# Patient Record
Sex: Female | Born: 2000 | Race: Black or African American | Hispanic: No | Marital: Single | Smoking: Never smoker
Health system: Southern US, Community
[De-identification: ages and names within clinical notes are randomized; demographics above are authoritative.]

## PROBLEM LIST (undated history)

## (undated) HISTORY — PX: BREAST SURGERY: SHX581

## (undated) HISTORY — PX: TONSILLECTOMY: SUR1361

---
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
## 2020-11-27 ENCOUNTER — Other Ambulatory Visit: Payer: Self-pay

## 2020-11-27 ENCOUNTER — Emergency Department (HOSPITAL_COMMUNITY): Payer: Medicaid Other

## 2020-11-27 ENCOUNTER — Encounter (HOSPITAL_COMMUNITY): Payer: Self-pay

## 2020-11-27 ENCOUNTER — Emergency Department (HOSPITAL_COMMUNITY)
Admission: EM | Admit: 2020-11-27 | Discharge: 2020-11-27 | Disposition: A | Discharge status: Home / Self Care | Payer: Medicaid Other | Attending: Emergency Medicine | Admitting: Emergency Medicine

## 2020-11-27 DIAGNOSIS — Z5321 Procedure and treatment not carried out due to patient leaving prior to being seen by health care provider: Secondary | ICD-10-CM | POA: Insufficient documentation

## 2020-11-27 DIAGNOSIS — R079 Chest pain, unspecified: Secondary | ICD-10-CM | POA: Diagnosis not present

## 2020-11-27 NOTE — ED Triage Notes (Signed)
Pt to er, pt states that she is here for some chest pain.  Pt states that the pain started after a phone meeting.  Pt states that when she moves the pain is worse.

## 2021-08-05 ENCOUNTER — Encounter (HOSPITAL_COMMUNITY): Payer: Self-pay | Admitting: Emergency Medicine

## 2021-08-05 ENCOUNTER — Emergency Department (HOSPITAL_COMMUNITY): Payer: Medicaid Other

## 2021-08-05 ENCOUNTER — Other Ambulatory Visit: Payer: Self-pay

## 2021-08-05 ENCOUNTER — Emergency Department (HOSPITAL_COMMUNITY)
Admission: EM | Admit: 2021-08-05 | Discharge: 2021-08-05 | Disposition: A | Discharge status: Home / Self Care | Payer: Medicaid Other | Attending: Emergency Medicine | Admitting: Emergency Medicine

## 2021-08-05 DIAGNOSIS — R42 Dizziness and giddiness: Secondary | ICD-10-CM | POA: Insufficient documentation

## 2021-08-05 DIAGNOSIS — R0789 Other chest pain: Secondary | ICD-10-CM | POA: Diagnosis not present

## 2021-08-05 DIAGNOSIS — R079 Chest pain, unspecified: Secondary | ICD-10-CM | POA: Diagnosis present

## 2021-08-05 LAB — D-DIMER, QUANTITATIVE: D-Dimer, Quant: 0.51 ug/mL-FEU — ABNORMAL HIGH (ref 0.00–0.50)

## 2021-08-05 LAB — CBC
HCT: 35.4 % — ABNORMAL LOW (ref 36.0–46.0)
Hemoglobin: 10.4 g/dL — ABNORMAL LOW (ref 12.0–15.0)
MCH: 23.4 pg — ABNORMAL LOW (ref 26.0–34.0)
MCHC: 29.4 g/dL — ABNORMAL LOW (ref 30.0–36.0)
MCV: 79.6 fL — ABNORMAL LOW (ref 80.0–100.0)
Platelets: 254 10*3/uL (ref 150–400)
RBC: 4.45 MIL/uL (ref 3.87–5.11)
RDW: 17.7 % — ABNORMAL HIGH (ref 11.5–15.5)
WBC: 7.5 10*3/uL (ref 4.0–10.5)
nRBC: 0 % (ref 0.0–0.2)

## 2021-08-05 LAB — BASIC METABOLIC PANEL
Anion gap: 6 (ref 5–15)
BUN: 8 mg/dL (ref 6–20)
CO2: 24 mmol/L (ref 22–32)
Calcium: 8.8 mg/dL — ABNORMAL LOW (ref 8.9–10.3)
Chloride: 107 mmol/L (ref 98–111)
Creatinine, Ser: 0.78 mg/dL (ref 0.44–1.00)
GFR, Estimated: >60 mL/min (ref >60)
Glucose, Bld: 98 mg/dL (ref 70–99)
Potassium: 3.4 mmol/L — ABNORMAL LOW (ref 3.5–5.1)
Sodium: 137 mmol/L (ref 135–145)

## 2021-08-05 LAB — TROPONIN I (HIGH SENSITIVITY)
Troponin I (High Sensitivity): <2 ng/L (ref <18)
Troponin I (High Sensitivity): <2 ng/L (ref <18)

## 2021-08-05 MED ORDER — IOHEXOL 350 MG/ML SOLN
75.0000 mL | Freq: Once | INTRAVENOUS | Status: AC | PRN
  Administered 2021-08-05: 75 mL via INTRAVENOUS

## 2021-08-05 NOTE — ED Provider Notes (Signed)
[facility] EMERGENCY DEPARTMENT Provider Note   CSN: [id number] Arrival date & time: 08/05/21  1424     History Chief Complaint  Patient presents with   Chest Pain    Mikayla Jordan is a 20 y.o. female.   Chest Pain Associated symptoms: dizziness   Associated symptoms: no abdominal pain, no back pain, no cough, no fatigue, no fever, no nausea, no numbness, no palpitations, no shortness of breath, no vomiting and no weakness        Mikayla Jordan is a 20 y.o. female who presents to the Emergency Department complaining of sudden onset of right-sided chest pain, dizziness and lightheadedness.  Symptoms began while driving.  She describes the right-sided chest pain as sharp and stabbing in quality.  Pain was brief lasting 2 to 3 minutes then spontaneously resolved.  Chest pain was associated with feeling lightheaded and dizzy which also resolved.  The dizziness returned after several minutes and she contacted EMS.  She endorses recent increased stressors and anxiety.  She states that she lost both parents and has to take care of her brother.  She also notes having elevated blood pressure today.  No chest pain or dizziness upon arrival.  She denies recent illness, fever, chills, cough or back pain.  She denies any new medications, illicit drug use, alcohol or smoking.  No history of PE or DVT.  She does not use any forms of birth control   History reviewed. No pertinent past medical history.  There are no problems to display for this patient.   Past Surgical History:  Procedure Laterality Date   BREAST SURGERY     TONSILLECTOMY       OB History   No obstetric history on file.     History reviewed. No pertinent family history.  Social History   Tobacco Use   Smoking status: Never   Smokeless tobacco: Never  Vaping Use   Vaping Use: Never used  Substance Use Topics   Alcohol use: Never   Drug use: Never    Home Medications Prior to Admission medications   Not on  File    Allergies    Patient has no known allergies.  Review of Systems   Review of Systems  Constitutional:  Negative for chills, fatigue and fever.  Respiratory:  Negative for cough and shortness of breath.   Cardiovascular:  Positive for chest pain. Negative for palpitations.  Gastrointestinal:  Negative for abdominal pain, nausea and vomiting.  Genitourinary:  Negative for dysuria, flank pain and hematuria.  Musculoskeletal:  Negative for arthralgias, back pain, myalgias, neck pain and neck stiffness.  Skin:  Negative for rash.  Neurological:  Positive for dizziness and light-headedness. Negative for seizures, syncope, speech difficulty, weakness and numbness.  Hematological:  Does not bruise/bleed easily.  Psychiatric/Behavioral:  Negative for confusion.    Physical Exam Updated Vital Signs BP 131/82   Pulse 82   Temp 98.1 F (36.7 C) (Oral)   Resp (!) 22   Ht 5\' 4"  (1.626 m)   Wt 95.3 kg   SpO2 100%   BMI 36.05 kg/m   Physical Exam Vitals and nursing note reviewed.  Constitutional:      Appearance: Normal appearance. She is not ill-appearing or toxic-appearing.  HENT:     Head: Normocephalic.  Eyes:     Conjunctiva/sclera: Conjunctivae normal.     Pupils: Pupils are equal, round, and reactive to light.  Cardiovascular:     Rate and Rhythm: Normal rate and regular  rhythm.     Pulses: Normal pulses.  Pulmonary:     Effort: Pulmonary effort is normal.     Breath sounds: Normal breath sounds. No wheezing.  Chest:     Chest wall: No tenderness.  Abdominal:     Palpations: Abdomen is soft.     Tenderness: There is no abdominal tenderness. There is no guarding or rebound.  Musculoskeletal:        General: Normal range of motion.     Cervical back: Normal range of motion.     Right lower leg: No edema.     Left lower leg: No edema.  Skin:    General: Skin is warm.     Capillary Refill: Capillary refill takes less than 2 seconds.     Findings: No rash.   Neurological:     General: No focal deficit present.     Mental Status: She is alert.     Sensory: No sensory deficit.     Motor: No weakness.  Psychiatric:        Mood and Affect: Mood normal.        Thought Content: Thought content normal.    ED Results / Procedures / Treatments   Labs (all labs ordered are listed, but only abnormal results are displayed) Labs Reviewed  BASIC METABOLIC PANEL - Abnormal; Notable for the following components:      Result Value   Potassium 3.4 (*)    Calcium 8.8 (*)    All other components within normal limits  CBC - Abnormal; Notable for the following components:   Hemoglobin 10.4 (*)    HCT 35.4 (*)    MCV 79.6 (*)    MCH 23.4 (*)    MCHC 29.4 (*)    RDW 17.7 (*)    All other components within normal limits  D-DIMER, QUANTITATIVE - Abnormal; Notable for the following components:   D-Dimer, Quant 0.51 (*)    All other components within normal limits  POC URINE PREG, ED  TROPONIN I (HIGH SENSITIVITY)  TROPONIN I (HIGH SENSITIVITY)    EKG Vent. rate 113 BPM PR interval 135 ms QRS duration 89 ms QT/QTcB 334/464 ms P-R-T axes 40 36 -8  Text interpretation: Sinus tachycardia with right atrial enlargement.    EKG reviewed by Dr. Bernette Mayers.  Radiology DG Chest 2 View  Result Date: 08/05/2021 CLINICAL DATA:  Chest pain and dizziness. EXAM: CHEST - 2 VIEW COMPARISON:  Chest radiograph 11/27/2020. FINDINGS: Stable cardiac and mediastinal contours. Minimal heterogeneous opacities right lung base. No pleural effusion or pneumothorax. Osseous structures unremarkable. IMPRESSION: Minimal heterogeneous opacities right lung base may represent atelectasis or infection. Electronically Signed   By: Annia Belt M.D.   On: 08/05/2021 15:57    Procedures   Medications Ordered in ED Medications  iohexol (OMNIPAQUE) 350 MG/ML injection 75 mL (75 mLs Intravenous Contrast Given 08/05/21 1715)    ED Course  I have reviewed the triage vital signs and  the nursing notes.  Pertinent labs & imaging results that were available during my care of the patient were reviewed by me and considered in my medical decision making (see chart for details).    MDM Rules/Calculators/A&P                           Patient here for evaluation of chest pain, dizziness and lightheadedness.  Symptoms were sudden in onset earlier today.  She notes increased stressors recently.  No  known history of anxiety or PE or DVT.  She does not use any forms of birth control or smoke cigarettes.  Symptoms have resolved upon arrival.  Vital signs reviewed.  On exam, patient well-appearing nontoxic.  Mildly hypertensive.  No history of hypertension.  She is also tachypneic.  No hypoxia. Labs interpreted by me, show no leukocytosis.  No electrolyte derangement.  Delta troponin is reassuring.  EKG without acute ischemic change.  Chest x-ray shows some heterogeneous opacities of the right lung base that may represent atelectasis or infection.  No clinical findings to suggest infection as patient is afebrile, she does not have an elevated white count and she is without cough.  D-dimer is only mildly elevated but given that patient has some risk factors, I will proceed with CT angio of the chest.  On recheck, patient resting comfortably.  Denies any symptoms at this time.  Blood pressure improved, systolic of 137.  CT angio of the chest is negative for evidence of PE.  Symptoms felt to be related to anxiety.  Doubt ACS.  I feel that patient is appropriate for discharge home, will give referral information to establish primary care.  All questions were answered.  Return precautions were discussed.   Final Clinical Impression(s) / ED Diagnoses Final diagnoses:  Atypical chest pain    Rx / DC Orders ED Discharge Orders     None        Pauline Aus, PA-C 08/05/21 1855    Pollyann Savoy, MD 08/06/21 [phone]

## 2021-08-05 NOTE — ED Triage Notes (Signed)
Pt arrived VIA [facility]. Pt c/o of chest pain and dizziness that started today.

## 2021-08-05 NOTE — Discharge Instructions (Addendum)
The CT of your chest today did not show evidence of a blood clot or pneumonia.  Your work-up was overall reassuring.  Your symptoms may be related to stress or anxiety.  I recommend that you follow-up with primary care.  I have provided a list of local providers to establish primary care.  I have also given you a list of resources for counseling if needed.  Return to the emergency department for any new or worsening symptoms.

## 2021-08-05 NOTE — ED Notes (Signed)
Patient denies any chest pain at this moment. Reports mild dizziness at the moment. States she has been under a lot of stress lately and not been eating well. Patient alert and orientated x4. Ambulatory.

## 2021-11-10 ENCOUNTER — Other Ambulatory Visit: Payer: Self-pay

## 2021-11-10 ENCOUNTER — Emergency Department (HOSPITAL_COMMUNITY)
Admission: EM | Admit: 2021-11-10 | Discharge: 2021-11-10 | Disposition: A | Discharge status: Home / Self Care | Payer: Medicaid Other | Attending: Emergency Medicine | Admitting: Emergency Medicine

## 2021-11-10 ENCOUNTER — Encounter (HOSPITAL_COMMUNITY): Payer: Self-pay

## 2021-11-10 DIAGNOSIS — Z79899 Other long term (current) drug therapy: Secondary | ICD-10-CM | POA: Insufficient documentation

## 2021-11-10 DIAGNOSIS — I10 Essential (primary) hypertension: Secondary | ICD-10-CM | POA: Diagnosis not present

## 2021-11-10 LAB — COMPREHENSIVE METABOLIC PANEL
ALT: 20 U/L (ref 0–44)
AST: 30 U/L (ref 15–41)
Albumin: 4.1 g/dL (ref 3.5–5.0)
Alkaline Phosphatase: 60 U/L (ref 38–126)
Anion gap: 8 (ref 5–15)
BUN: 9 mg/dL (ref 6–20)
CO2: 24 mmol/L (ref 22–32)
Calcium: 9.4 mg/dL (ref 8.9–10.3)
Chloride: 105 mmol/L (ref 98–111)
Creatinine, Ser: 0.65 mg/dL (ref 0.44–1.00)
GFR, Estimated: >60 mL/min (ref >60)
Glucose, Bld: 81 mg/dL (ref 70–99)
Potassium: 4.1 mmol/L (ref 3.5–5.1)
Sodium: 137 mmol/L (ref 135–145)
Total Bilirubin: 0.5 mg/dL (ref 0.3–1.2)
Total Protein: 7.4 g/dL (ref 6.5–8.1)

## 2021-11-10 LAB — CBC WITH DIFFERENTIAL/PLATELET
Abs Immature Granulocytes: 0.02 10*3/uL (ref 0.00–0.07)
Basophils Absolute: 0.1 10*3/uL (ref 0.0–0.1)
Basophils Relative: 1 %
Eosinophils Absolute: 0.4 10*3/uL (ref 0.0–0.5)
Eosinophils Relative: 5 %
HCT: 34.8 % — ABNORMAL LOW (ref 36.0–46.0)
Hemoglobin: 10.1 g/dL — ABNORMAL LOW (ref 12.0–15.0)
Immature Granulocytes: 0 %
Lymphocytes Relative: 27 %
Lymphs Abs: 2.3 10*3/uL (ref 0.7–4.0)
MCH: 23.2 pg — ABNORMAL LOW (ref 26.0–34.0)
MCHC: 29 g/dL — ABNORMAL LOW (ref 30.0–36.0)
MCV: 79.8 fL — ABNORMAL LOW (ref 80.0–100.0)
Monocytes Absolute: 0.7 10*3/uL (ref 0.1–1.0)
Monocytes Relative: 9 %
Neutro Abs: 4.9 10*3/uL (ref 1.7–7.7)
Neutrophils Relative %: 58 %
Platelets: 317 10*3/uL (ref 150–400)
RBC: 4.36 MIL/uL (ref 3.87–5.11)
RDW: 18 % — ABNORMAL HIGH (ref 11.5–15.5)
WBC: 8.4 10*3/uL (ref 4.0–10.5)
nRBC: 0 % (ref 0.0–0.2)

## 2021-11-10 MED ORDER — LISINOPRIL 10 MG PO TABS: 10.0000 mg | ORAL_TABLET | Freq: Every day | ORAL | 0 refills | Status: DC

## 2021-11-10 NOTE — Discharge Instructions (Signed)
Follow-up with your family doctor in the next 1 to 2 weeks for your blood pressure

## 2021-11-10 NOTE — ED Provider Notes (Signed)
[facility] EMERGENCY DEPARTMENT Provider Note   CSN: [id number] Arrival date & time: 11/10/21  1907     History  Chief Complaint  Patient presents with   Hypertension    Dizziness     Mikayla Jordan is a 21 y.o. female.  Patient complains of dizziness.  She has had high blood pressure before that has caused dizziness.  She never followed up with anybody when she was seen for this.  She is on no other medicines  The history is provided by the patient and medical records. A language interpreter was used.  Hypertension This is a recurrent problem. The current episode started more than 2 days ago. The problem occurs constantly. The problem has not changed since onset.Pertinent negatives include no chest pain, no abdominal pain and no headaches. Nothing aggravates the symptoms. Nothing relieves the symptoms. She has tried nothing for the symptoms.      Home Medications Prior to Admission medications   Medication Sig Start Date End Date Taking? Authorizing Provider  lisinopril (ZESTRIL) 10 MG tablet Take 1 tablet (10 mg total) by mouth daily. 11/10/21  Yes Milton Ferguson, MD      Allergies    Patient has no known allergies.    Review of Systems   Review of Systems  Constitutional:  Negative for appetite change and fatigue.  HENT:  Negative for congestion, ear discharge and sinus pressure.   Eyes:  Negative for discharge.  Respiratory:  Negative for cough.   Cardiovascular:  Negative for chest pain.  Gastrointestinal:  Negative for abdominal pain and diarrhea.  Genitourinary:  Negative for frequency and hematuria.  Musculoskeletal:  Negative for back pain.  Skin:  Negative for rash.  Neurological:  Positive for dizziness. Negative for seizures and headaches.  Psychiatric/Behavioral:  Negative for hallucinations.    Physical Exam Updated Vital Signs BP (!) 133/91    Pulse 90    Temp 99.3 F (37.4 C) (Oral)    Resp 14    Ht 5\' 4"  (1.626 m)    Wt 90.7 kg    SpO2 100%    BMI  34.33 kg/m  Physical Exam Vitals and nursing note reviewed.  Constitutional:      Appearance: She is well-developed.  HENT:     Head: Normocephalic.     Nose: Nose normal.  Eyes:     General: No scleral icterus.    Conjunctiva/sclera: Conjunctivae normal.  Neck:     Thyroid: No thyromegaly.  Cardiovascular:     Rate and Rhythm: Normal rate and regular rhythm.     Heart sounds: No murmur heard.   No friction rub. No gallop.  Pulmonary:     Breath sounds: No stridor. No wheezing or rales.  Chest:     Chest wall: No tenderness.  Abdominal:     General: There is no distension.     Tenderness: There is no abdominal tenderness. There is no rebound.  Musculoskeletal:        General: Normal range of motion.     Cervical back: Neck supple.  Lymphadenopathy:     Cervical: No cervical adenopathy.  Skin:    Findings: No erythema or rash.  Neurological:     Mental Status: She is alert and oriented to person, place, and time.     Motor: No abnormal muscle tone.     Coordination: Coordination normal.  Psychiatric:        Behavior: Behavior normal.    ED Results / Procedures / Treatments  Labs (all labs ordered are listed, but only abnormal results are displayed) Labs Reviewed  CBC WITH DIFFERENTIAL/PLATELET - Abnormal; Notable for the following components:      Result Value   Hemoglobin 10.1 (*)    HCT 34.8 (*)    MCV 79.8 (*)    MCH 23.2 (*)    MCHC 29.0 (*)    RDW 18.0 (*)    All other components within normal limits  COMPREHENSIVE METABOLIC PANEL    EKG None  Radiology No results found.  Procedures Procedures    Medications Ordered in ED Medications - No data to display  ED Course/ Medical Decision Making/ A&P                           Medical Decision Making  Patient with mild hypertension and dizziness.  Labs unremarkable and EKG unremarkable.  Patient is placed on a low-dose of lisinopril and will follow up with her PCP   This patient presents to  the ED for concern of dizziness, this involves an extensive number of treatment options, and is a complaint that carries with it a high risk of complications and morbidity.  The differential diagnosis includes hypertension, vertigo, CVA   Co morbidities that complicate the patient evaluation  Hypertension   Additional history obtained:  Additional history obtained from patient External records from outside source obtained and reviewed including hospital record   Lab Tests:  I Ordered, and personally interpreted labs.  The pertinent results include: Hemoglobin showed some anemia at 10.1   Imaging Studies ordered:  No x-rays  Cardiac Monitoring:  The patient was maintained on a cardiac monitor.  I personally viewed and interpreted the cardiac monitored which showed an underlying rhythm of: Normal sinus rhythm   Medicines ordered and prescription drug management:  No medicines given Reevaluation of the patient after these medicines showed that the patient stayed the same I have reviewed the patients home medicines and have made adjustments as needed   Test Considered:  CT head  Critical Interventions:  None  Consultations Obtained: No consult   Problem List / ED Course:  Hypertension dizziness   Reevaluation:  After the interventions noted above, I reevaluated the patient and found that they have :improved   Social Determinants of Health:  None   Dispostion:  After consideration of the diagnostic results and the patients response to treatment, I feel that the patent would benefit from discharge home on a low-dose lisinopril and follow-up with PCP.         Final Clinical Impression(s) / ED Diagnoses Final diagnoses:  Primary hypertension    Rx / DC Orders ED Discharge Orders          Ordered    lisinopril (ZESTRIL) 10 MG tablet  Daily        11/10/21 2310              Milton Ferguson, MD 11/12/21 1149

## 2021-11-10 NOTE — ED Triage Notes (Addendum)
Pt reports dizziness and hypertension that started today around 6 pm, pt says she had an episode in October, was seen here and told it was stress induced and instructed to f/u with Dr, however pt says she has had any f/u. Per EMS pt BP was 142/108.  Pt denies dizziness at this time.

## 2022-01-11 ENCOUNTER — Ambulatory Visit: Payer: Medicaid Other | Admitting: Nurse Practitioner

## 2022-03-06 ENCOUNTER — Ambulatory Visit: Payer: Medicaid Other | Admitting: Nurse Practitioner

## 2022-03-24 ENCOUNTER — Encounter (HOSPITAL_COMMUNITY): Payer: Self-pay | Admitting: Emergency Medicine

## 2022-03-24 ENCOUNTER — Emergency Department (HOSPITAL_COMMUNITY)
Admission: EM | Admit: 2022-03-24 | Discharge: 2022-03-24 | Disposition: A | Discharge status: Home / Self Care | Payer: Medicaid Other | Attending: Emergency Medicine | Admitting: Emergency Medicine

## 2022-03-24 DIAGNOSIS — I10 Essential (primary) hypertension: Secondary | ICD-10-CM | POA: Insufficient documentation

## 2022-03-24 DIAGNOSIS — D500 Iron deficiency anemia secondary to blood loss (chronic): Secondary | ICD-10-CM | POA: Diagnosis not present

## 2022-03-24 DIAGNOSIS — Z72820 Sleep deprivation: Secondary | ICD-10-CM | POA: Diagnosis not present

## 2022-03-24 DIAGNOSIS — F410 Panic disorder [episodic paroxysmal anxiety] without agoraphobia: Secondary | ICD-10-CM | POA: Diagnosis present

## 2022-03-24 LAB — I-STAT CHEM 8, ED
BUN: 5 mg/dL — ABNORMAL LOW (ref 6–20)
Calcium, Ion: 1.24 mmol/L (ref 1.15–1.40)
Chloride: 106 mmol/L (ref 98–111)
Creatinine, Ser: 0.8 mg/dL (ref 0.44–1.00)
Glucose, Bld: 106 mg/dL — ABNORMAL HIGH (ref 70–99)
HCT: 30 % — ABNORMAL LOW (ref 36.0–46.0)
Hemoglobin: 10.2 g/dL — ABNORMAL LOW (ref 12.0–15.0)
Potassium: 3.6 mmol/L (ref 3.5–5.1)
Sodium: 141 mmol/L (ref 135–145)
TCO2: 24 mmol/L (ref 22–32)

## 2022-03-24 LAB — I-STAT BETA HCG BLOOD, ED (MC, WL, AP ONLY): I-stat hCG, quantitative: <5 m[IU]/mL (ref <5)

## 2022-03-24 NOTE — ED Notes (Signed)
Patient verbalizes understanding of discharge instructions. Opportunity for questioning and answers were provided. Armband removed by staff, pt discharged from ED. Ambulated out to lobby  

## 2022-03-24 NOTE — Discharge Instructions (Signed)
Substance Abuse Treatment Programs ° °Intensive Outpatient Programs °[facility]     °[address]      °[place], [place]                   °[phone]      ° °[facility] °[address] °[place], [place] °[phone] ° °[facility]     °(Inpatient and outpatient)     °[address]           °[phone]   ° °[facility] °336-288-1484 (Suboxone and Methadone) ° °[address]      °[place], [place] [address]      °336-882-2125      ° °[address] °[place], [place] °852-3033 ° °[facility] (Outpatient/Inpatient, Chemical)    °(insurance only) [phone]      °       °[facility] (Groups & Residential) °[place], [place] °336-389-1413 ° °   °[facility]     °[address]     °[place], [place]      °336-389-1413      ° °[facility] (for caregivers and family) °[address] °[place], [place] °336-299-4655 ° ° ° ° ° °Residential Treatment Programs °[facility]      °[address], [place], [place] [address]  °([phone]      ° °[facility] °[address] [place], [place] [address] °[phone] ° °[facility]        °336-248-8914      ° °[facility] °1-800-659-3381 ° °[facility]             °[address]                                         °[place], [place]                                                °877-615-2722 or 336-784-9470                              ° °[facility] [place] °[address] °[place], [address] °1.877.941.8954 ° °[facility]    °[address]      °[place], [place]     °[phone]      ° °[facility] °[address] °[place], [place] °336-285-9073 ° °[facility]   °[address]     °[place], [place] [address]     °[phone]      °Admissions: 8am-3pm M-F ° °[facility] °[address] °[place] °336-227-7417 ° °[facility]: Residential Program (90 Days)   °[place], [place]      °336-725-8389 or 800-758-6077    ° °[facility] °[place], [place] °(Walk in Hours over the weekend or by referral) ° °[facility] °[address], [place], [place] [address] °([phone] ° °Crisis Mobile: [facility]:  1-877-626-1772 (for crisis response 24 hours a day) °[facility] Hotline:      1-800-256-2452 °Outpatient Psychiatry and Counseling ° °[facility]: Mobile Crisis   Management 24 hours:  1-877-626-1772 ° °[facility] sliding scale fee and walk in schedule: M-F 8am-12pm/1pm-3pm °[address]  °[place], [place] [address] °[phone] ° °[facility] °[address] °[place], [place] [address] °336-703-9650 ° °[facility] ([facility] known [facility])- new patient walk-in appointments available Monday - Friday 8am -3pm.          °[address] °[place], [place] [address] °336-676-6840 or crisis line- 336-676-6905 ° °[facility] Intensive Outpatient Therapy Program °[address] °[place], [place] [address] °336-832-9804 ° °[facility]                  °Crisis Services      °336.641.4993      °[address]     °[place], [place] [address]                ° °[facility]   °[facility] °[phone] °[address] °[place], [place] [address] ° ° °[facility]          °[address],  °[place], [place] [address]       °([phone] ° °[facility] °[address] °[place], [place] [address] °336-292-1510 ° °[facility]    °[address]    °[place], [place] [address]     °336-632-3505      ° °Parish McKinney, MD     °[address]     °[place] [address]     °336-282-1251     °  °[facility] °[address] °[place] [address] ° °[facility]     °[address]     °[place], [place]      °336-542-2076      ° °[facility] °Shamsher Ahluwalia, MD °[address] °[place], [place] [address] °336-420-9558 ° °[facility]     °[address]     °[place], [place] [address]     °336-274-1237      ° °[facility] °[address] °[place], [place] [address] °336-854-4450 °Resources for Temporary Residential Assistance/Crisis Centers ° °[facility] CENTERS °[facility] °M-F 8am-3pm   °[address] [place], [place] [address]   336-332-0824 °Services include: laundry, barbering, support groups, case management, phone  & computer access, showers, AA/NA mtgs, mental health/substance abuse nurse, job skills class, disability information, VA assistance, spiritual classes, etc.  ° °HOMELESS SHELTERS ° °[facility]     °[facility]   °[address] [place] [address], [place]     °336.271.5959       °       °[facility] (women and children)       °[address] °, [place] [address] °336-275-0820 °[email or web address] for application and process °Application Required ° °[facility]   °[address]    °[place] [address]     °336.886.4922       °             °[facility] °[address] °, [place] [address] °336.273.5572 °336-235-0363(schedule application appt.) °Application Required ° °[facility] (women only)    °[address]     °[place], [place] [address]     °336-884-1039      °  Intake starts 6pm daily °Need valid ID, SSC, & Police report °[facility] [place] °[address] °[place], [place] °336-881-5420 °Application Required ° °[facility] (men only)     °[address]      °[place], [place]     °[phone]      ° °[facility] °(Pregnant women only) °[address] °[place], [place] °336-275-0206 ° °[facility]      °[address]      °[place], [place] [address]     °336-722-9951      °       °[facility] °[address] °[place], [place] °336-723-1848 °90 day commitment/SA/Application process ° °[facility](men only)     °[address]     °[place], [place]     °[phone]       °Check-in at 7pm     °       °[facility] °[address] °[place], [place] [address] °336-248-6684 °Men/Women/Women and Children must be there by 7 pm ° °[facility] °[place], [place] °336-722-8721                ° °

## 2022-03-24 NOTE — ED Provider Notes (Signed)
[facility] EMERGENCY DEPARTMENT Provider Note   CSN: [id number] Arrival date & time: 03/24/22  0143     History  Chief Complaint  Patient presents with   Anxiety    Mikayla Jordan is a 21 y.o. female.  The history is provided by the patient.  Anxiety This is a new problem. The problem occurs constantly. The problem has been gradually improving. Associated symptoms include shortness of breath. Pertinent negatives include no chest pain.  Patient presents for concerns for anxiety attack.  She reports several hours ago she started feeling anxious, had numbness in her hands and feet and felt short of breath.  She called EMS and they checked her blood pressure.  Since her symptoms returned she decided to be checked out in the ER.  She is not currently being treated for anxiety.  She reports significant stressors with family, as well as home life and keeping up with a full college workload    Home Medications Prior to Admission medications   Medication Sig Start Date End Date Taking? Authorizing Provider  lisinopril (ZESTRIL) 10 MG tablet Take 1 tablet (10 mg total) by mouth daily. 11/10/21   Milton Ferguson, MD      Allergies    Patient has no known allergies.    Review of Systems   Review of Systems  Respiratory:  Positive for shortness of breath.   Cardiovascular:  Negative for chest pain.  Neurological:  Positive for numbness. Negative for weakness.  Psychiatric/Behavioral:  Positive for sleep disturbance. Negative for suicidal ideas. The patient is nervous/anxious.    Physical Exam Updated Vital Signs BP (!) 142/108   Pulse 94   Temp 98.3 F (36.8 C) (Oral)   Resp 17   Wt 95.3 kg   SpO2 100%   BMI 36.05 kg/m  Physical Exam CONSTITUTIONAL: Well developed/well nourished, makes good eye contact HEAD: Normocephalic/atraumatic EYES: EOMI ENMT: Mucous membranes moist NECK: supple no meningeal signs CV: S1/S2 noted, no murmurs/rubs/gallops noted LUNGS: Lungs are clear to  auscultation bilaterally, no apparent distress NEURO: Pt is awake/alert/appropriate, moves all extremitiesx4.  No facial droop.  No arm or leg drift.   EXTREMITIES: pulses normal/equalx4, full ROM SKIN: warm, color normal PSYCH: Mildly anxious  ED Results / Procedures / Treatments   Labs (all labs ordered are listed, but only abnormal results are displayed) Labs Reviewed  I-STAT CHEM 8, ED - Abnormal; Notable for the following components:      Result Value   BUN 5 (*)    Glucose, Bld 106 (*)    Hemoglobin 10.2 (*)    HCT 30.0 (*)    All other components within normal limits  I-STAT BETA HCG BLOOD, ED ([facility], [facility], [facility] ONLY) - Normal  I-STAT BETA HCG BLOOD, ED ([facility], [facility], [facility] ONLY)    EKG EKG Interpretation  Date/Time:  Saturday Mar 24 2022 03:15:07 EDT Ventricular Rate:  94 PR Interval:  179 QRS Duration: 87 QT Interval:  352 QTC Calculation: 441 R Axis:   36 Text Interpretation: Sinus rhythm Borderline T abnormalities, inferior leads No significant change since last tracing Confirmed by Ripley Fraise [phone]) on 03/24/2022 3:25:17 AM  Radiology No results found.  Procedures Procedures    Medications Ordered in ED Medications - No data to display  ED Course/ Medical Decision Making/ A&P                           Medical Decision Making Amount and/or Complexity  of Data Reviewed ECG/medicine tests: ordered.   This patient presents to the ED for concern of numbness and anxiety, this involves an extensive number of treatment options, and is a complaint that carries with it a high risk of complications and morbidity.  The differential diagnosis includes but is not limited to panic attack, electrolyte disturbance, CVA  Comorbidities that complicate the patient evaluation: Patient's presentation is complicated by their history of hypertension  Social Determinants of Health: Patient's lack of prescription access and impaired access to primary care  increases the complexity of  managing their presentation   Lab Tests: I Ordered, and personally interpreted labs.  The pertinent results include: Chronic anemia   Patient's presentation is most consistent with  acute complicated illness/injury requiring diagnostic workup  Disposition: After consideration of the diagnostic results and the patient's response to treatment,  I feel that the patent would benefit from discharge   .    Patient presents with probable anxiety attack.  She is now feeling improved.  She described diffuse numbness and shortness of breath.  She reports decreased sleep recently and significant stressors.  She denies suicidal thoughts.  She declines any medications at this time.  I encouraged her to follow-up as an outpatient as she would benefit from therapy.  She reports she has good support at her church.        Final Clinical Impression(s) / ED Diagnoses Final diagnoses:  Anxiety attack    Rx / DC Orders ED Discharge Orders     None         Ripley Fraise, MD 03/24/22 [phone]

## 2022-03-24 NOTE — ED Notes (Signed)
I-stat Beta NEGATIVE. Machine not crossing over

## 2022-03-24 NOTE — ED Triage Notes (Signed)
Pt in from home with c/o bilateral hand and leg numbness and tingling, onset at 0030 tonight. Pt states she has been anxious throughout the night, called EMS to her house PTA in ED, and they said to check in at ED if anxiety worsened. Pt states worsened anxiety over past hour. Denies any cp, dizziness or sob.

## 2022-04-09 ENCOUNTER — Ambulatory Visit: Payer: Medicaid Other | Admitting: Family Medicine

## 2022-05-01 ENCOUNTER — Ambulatory Visit
Admission: EM | Admit: 2022-05-01 | Discharge: 2022-05-01 | Disposition: A | Discharge status: Home / Self Care | Payer: Medicaid Other | Attending: Family Medicine | Admitting: Family Medicine

## 2022-05-01 DIAGNOSIS — T148XXA Other injury of unspecified body region, initial encounter: Secondary | ICD-10-CM | POA: Diagnosis not present

## 2022-05-04 NOTE — ED Provider Notes (Signed)
[facility]    CSN: [id number] Arrival date & time: 05/01/22  1933      History   Chief Complaint Chief Complaint  Patient presents with   Foreign Body in Skin    HPI Mikayla Jordan is a 21 y.o. female.   Presenting today with a large splinter to the palm of her right hand that occurred today while helping a friend on a wooden deck.  She states she tried pulling it out with her hand but it started splitting into little pieces.  Denies redness, drainage, bleeding, numbness, tingling, decreased range of motion.  Thinks her last tetanus shot was within the last 5 years.   History reviewed. No pertinent past medical history.  There are no problems to display for this patient.   Past Surgical History:  Procedure Laterality Date   BREAST SURGERY     TONSILLECTOMY      OB History   No obstetric history on file.      Home Medications    Prior to Admission medications   Medication Sig Start Date End Date Taking? Authorizing Provider  lisinopril (ZESTRIL) 10 MG tablet Take 1 tablet (10 mg total) by mouth daily. 11/10/21   Bethann Berkshire, MD    Family History History reviewed. No pertinent family history.  Social History Social History   Tobacco Use   Smoking status: Never   Smokeless tobacco: Never  Vaping Use   Vaping Use: Never used  Substance Use Topics   Alcohol use: Never   Drug use: Never     Allergies   Patient has no known allergies.   Review of Systems Review of Systems Per HPI  Physical Exam Triage Vital Signs ED Triage Vitals  Enc Vitals Group     BP 05/01/22 1945 123/79     Pulse Rate 05/01/22 1945 88     Resp 05/01/22 1945 16     Temp 05/01/22 1945 98 F (36.7 C)     Temp Source 05/01/22 1945 Oral     SpO2 05/01/22 1945 98 %     Weight --      Height --      Head Circumference --      Peak Flow --      Pain Score 05/01/22 1946 0     Pain Loc --      Pain Edu? --      Excl. in GC? --    No data found.  Updated  Vital Signs BP 123/79 (BP Location: Right Arm)   Pulse 88   Temp 98 F (36.7 C) (Oral)   Resp 16   LMP 04/24/2022   SpO2 98%   Visual Acuity Right Eye Distance:   Left Eye Distance:   Bilateral Distance:    Right Eye Near:   Left Eye Near:    Bilateral Near:     Physical Exam Vitals and nursing note reviewed.  Constitutional:      Appearance: Normal appearance. She is not ill-appearing.  HENT:     Head: Atraumatic.  Eyes:     Extraocular Movements: Extraocular movements intact.     Conjunctiva/sclera: Conjunctivae normal.  Cardiovascular:     Rate and Rhythm: Normal rate and regular rhythm.     Heart sounds: Normal heart sounds.  Pulmonary:     Effort: Pulmonary effort is normal.     Breath sounds: Normal breath sounds.  Musculoskeletal:        General: Normal range of motion.  Cervical back: Normal range of motion and neck supple.  Skin:    General: Skin is warm.     Comments: 1.5 cm wooden splinter present to palmar aspect of right hand.  No bleeding or surrounding erythema, edema  Neurological:     Mental Status: She is alert and oriented to person, place, and time.     Comments: Right hand neurovascularly intact  Psychiatric:        Mood and Affect: Mood normal.        Thought Content: Thought content normal.        Judgment: Judgment normal.    UC Treatments / Results  Labs (all labs ordered are listed, but only abnormal results are displayed) Labs Reviewed - No data to display  EKG  Radiology No results found.  Procedures Foreign Body Removal  Date/Time: 05/01/2022 8:00 PM  Performed by: Particia Nearing, PA-C Authorized by: Particia Nearing, PA-C   Consent:    Consent obtained:  Verbal   Consent given by:  Patient   Risks, benefits, and alternatives were discussed: yes     Risks discussed:  Bleeding, pain, infection and incomplete removal   Alternatives discussed:  Alternative treatment Universal protocol:    Procedure  explained and questions answered to patient or proxy's satisfaction: yes     Relevant documents present and verified: yes     Patient identity confirmed:  Verbally with patient Location:    Location:  Hand   Hand location:  R palm   Depth:  Intradermal   Tendon involvement:  None Pre-procedure details:    Imaging:  None   Neurovascular status: intact   Anesthesia:    Anesthesia method:  Topical application   Topical anesthesia: Cold spray. Procedure type:    Procedure complexity:  Simple Procedure details:    Localization method:  Finder needle   Bloodless field: yes     Removal mechanism:  Forceps   Foreign bodies recovered:  2   Description:  2 pieces of wooden splinter   Intact foreign body removal: yes   Post-procedure details:    Neurovascular status: intact     Confirmation:  No additional foreign bodies on visualization   Skin closure:  None   Dressing:  Adhesive bandage   Procedure completion:  Tolerated well, no immediate complications  (including critical care time)  Medications Ordered in UC Medications - No data to display  Initial Impression / Assessment and Plan / UC Course  I have reviewed the triage vital signs and the nursing notes.  Pertinent labs & imaging results that were available during my care of the patient were reviewed by me and considered in my medical decision making (see chart for details).     Both pieces of splinter removed without complication, patient tolerated procedure well.  Discussed home wound care, return precautions.  Final Clinical Impressions(s) / UC Diagnoses   Final diagnoses:  Splinter   Discharge Instructions   None    ED Prescriptions   None    PDMP not reviewed this encounter.   Particia Nearing, [place] 05/04/22 1919

## 2022-05-29 ENCOUNTER — Ambulatory Visit: Payer: Medicaid Other | Admitting: Family Medicine

## 2022-06-04 ENCOUNTER — Ambulatory Visit: Payer: Medicaid Other | Admitting: Family Medicine

## 2022-06-18 ENCOUNTER — Ambulatory Visit: Payer: Medicaid Other | Admitting: Family Medicine

## 2022-12-17 IMAGING — CT CT ANGIO CHEST
2 of 6 series · 19 of 46 positions shown · IV contrast (omnipaque)
Comparison: None.

CLINICAL DATA: Chest pain and shortness of breath beginning today.
Dizziness. Elevated D-dimer. Clinical suspicion for pulmonary
embolism.

EXAM:
CT ANGIOGRAPHY CHEST WITH CONTRAST
TECHNIQUE: Multidetector CT imaging of the chest was performed using the
standard protocol during bolus administration of intravenous
contrast. Multiplanar CT image reconstructions and MIPs were
obtained to evaluate the vascular anatomy.
CONTRAST:  75mL OMNIPAQUE IOHEXOL 350 MG/ML SOLN

[Series 6: pe axial thins · axial · 0.80mm/px · z∈[+1288,+1529]mm · 16 of 329 slices shown]
[im 14/329  lung]
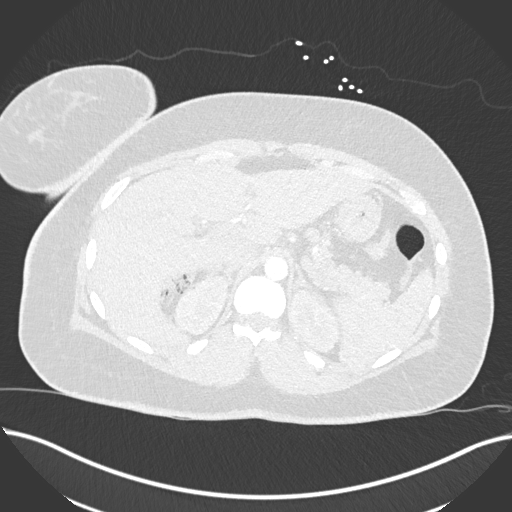
[im 42/329  soft-tissue]
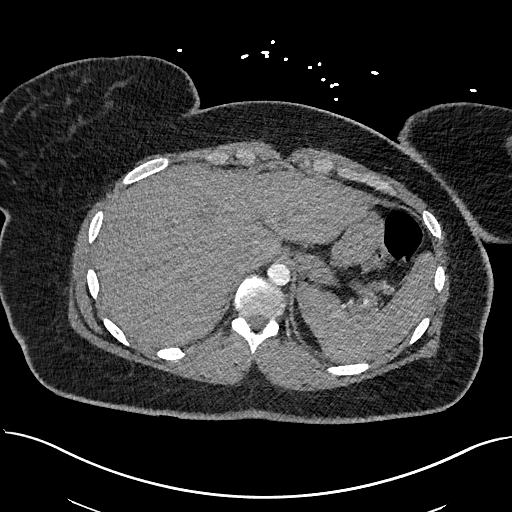
[im 55/329  lung]
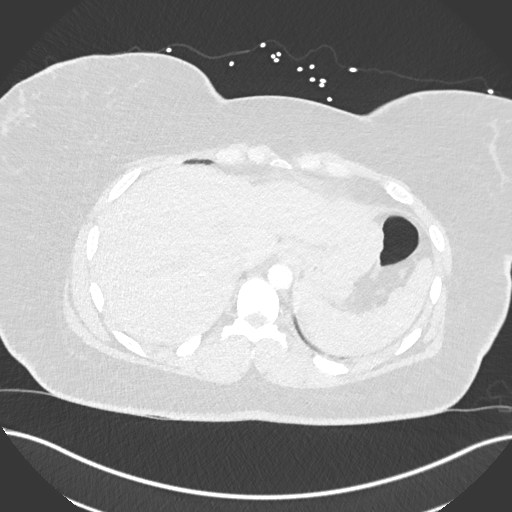
[im 83/329  soft-tissue]
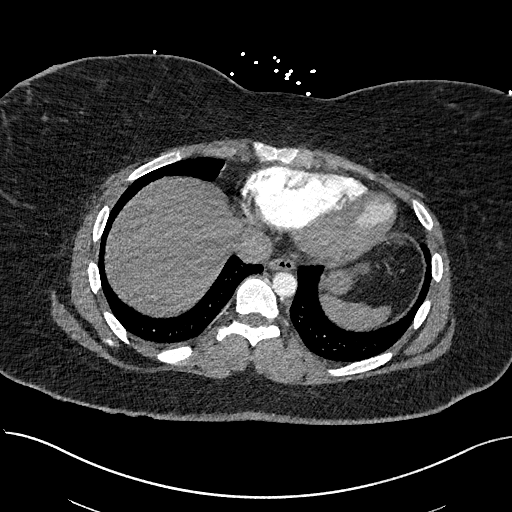
[im 96/329  lung]
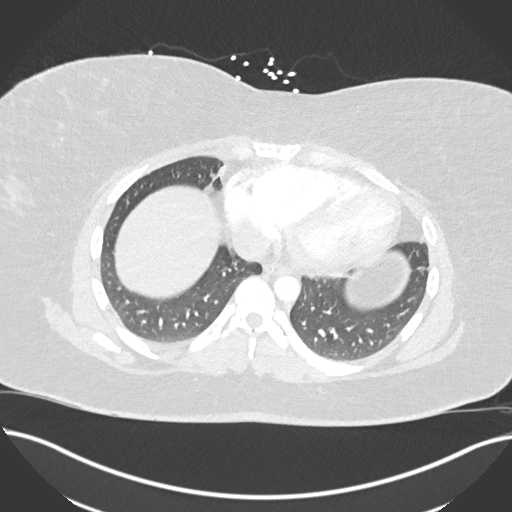
[im 110/329  soft-tissue]
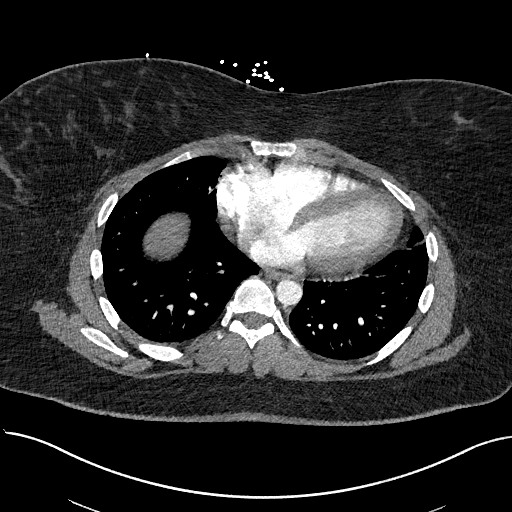
[im 137/329  lung]
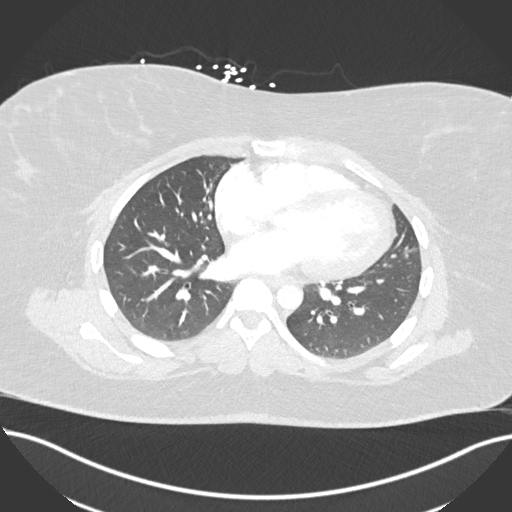
[im 151/329  soft-tissue]
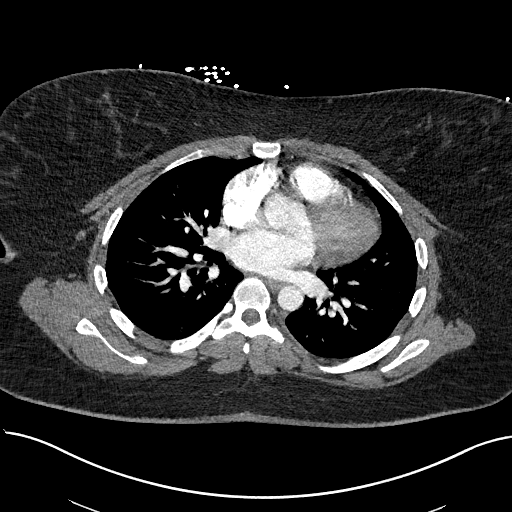
[im 178/329  lung]
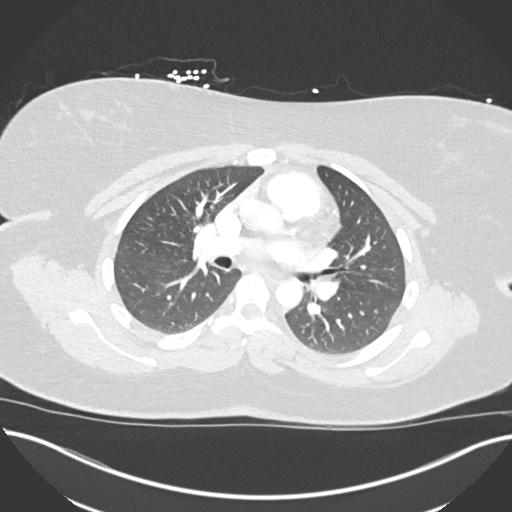
[im 192/329  soft-tissue]
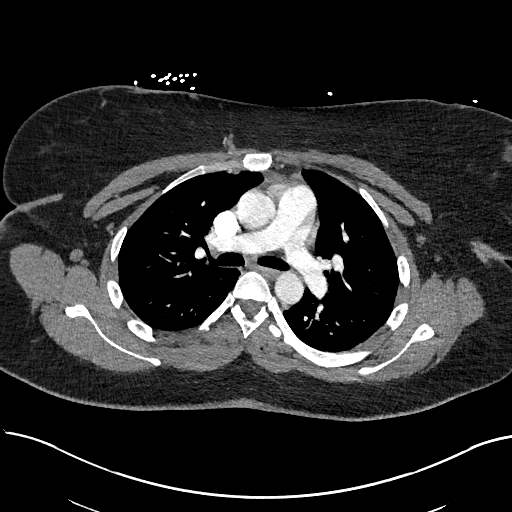
[im 219/329  lung]
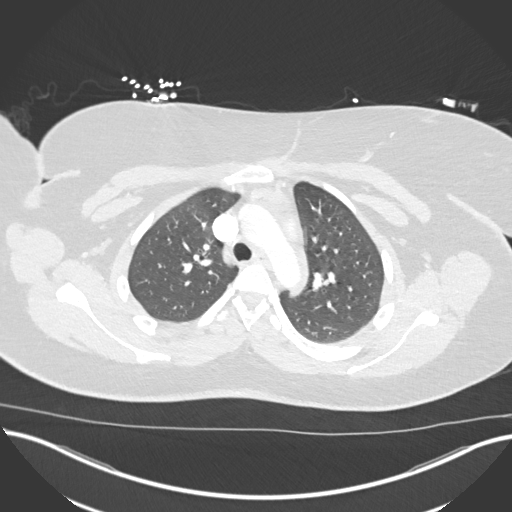
[im 233/329  soft-tissue]
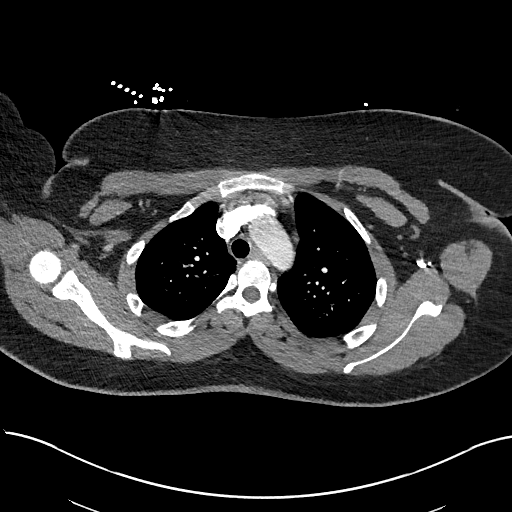
[im 247/329  lung]
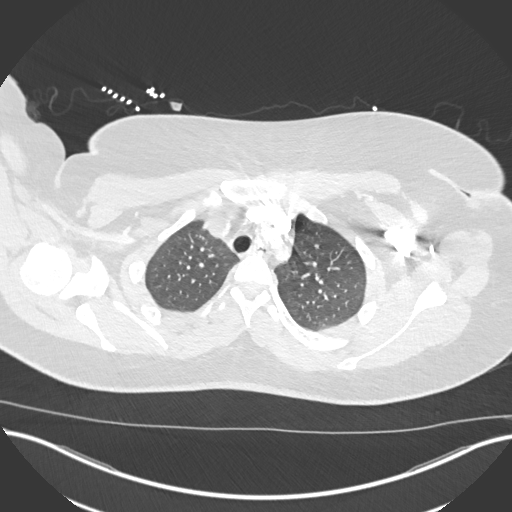
[im 274/329  soft-tissue]
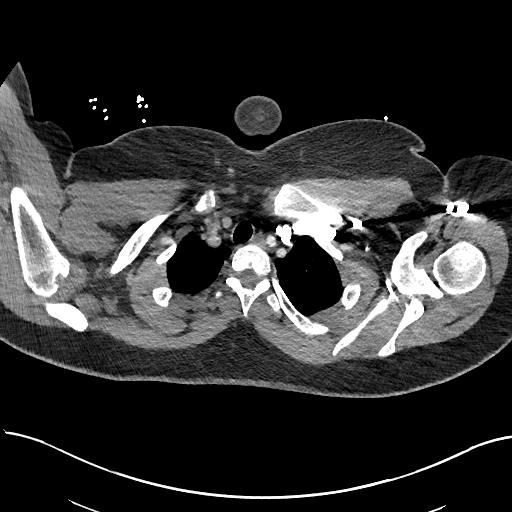
[im 288/329  lung]
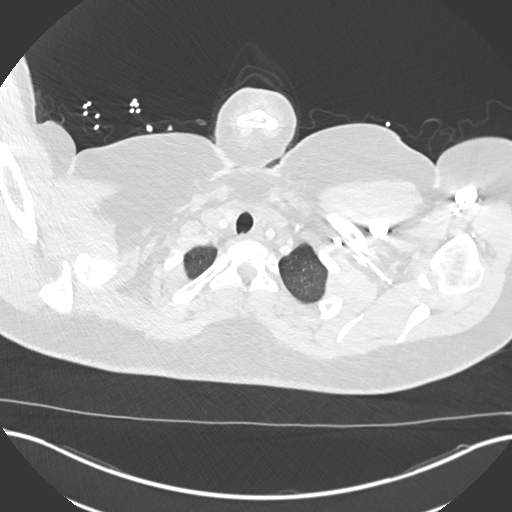
[im 315/329  soft-tissue]
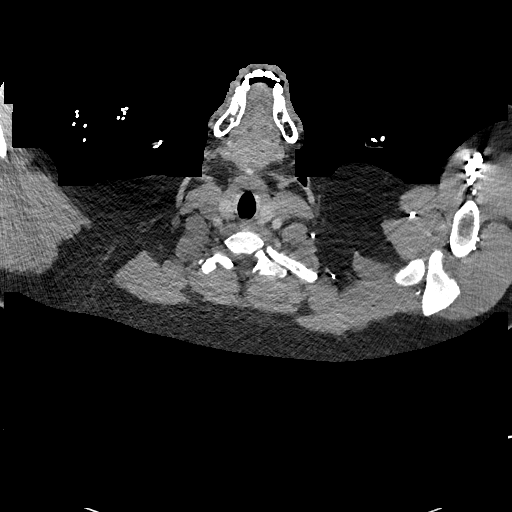

[Series 8: cor soft · coronal · 0.59mm/px · 3 of 141 slices shown]
[im 36/141  soft-tissue]
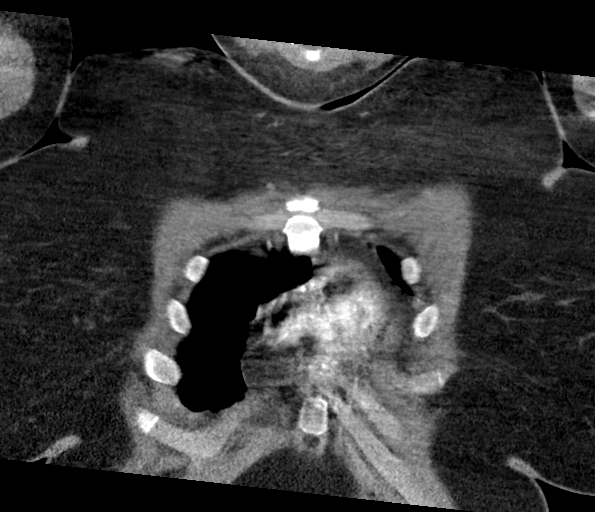
[im 71/141  soft-tissue]
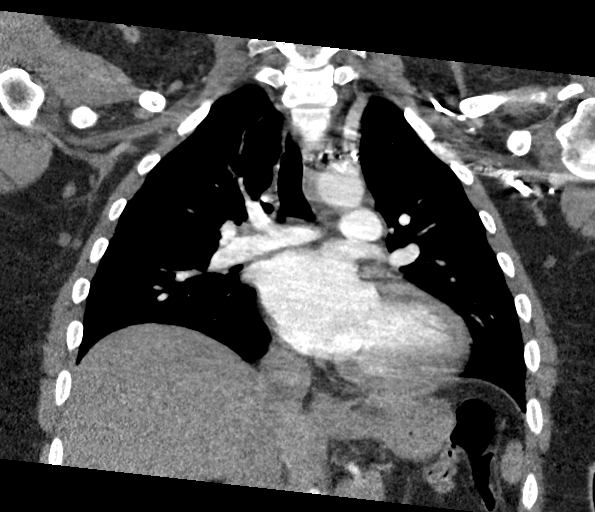
[im 106/141  soft-tissue]
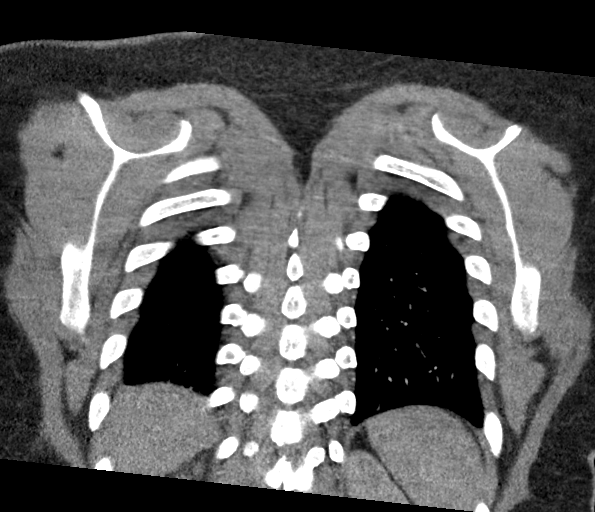

[19 of 46 positions shown; findings below may reference images not displayed]

FINDINGS: Cardiovascular: Satisfactory opacification of pulmonary arteries
noted, and no pulmonary emboli identified. No evidence of thoracic
aortic dissection or aneurysm.

Mediastinum/Nodes: No masses or pathologically enlarged lymph nodes
identified.

Lungs/Pleura: No pulmonary mass, infiltrate, or effusion.

Upper abdomen: No acute findings.

Musculoskeletal: No suspicious bone lesions identified.

Review of the MIP images confirms the above findings.
IMPRESSION: Negative. No evidence of pulmonary embolism or other active disease.

## 2022-12-17 IMAGING — CR DG CHEST 2V
2 series · 2 of 2 positions shown · non-contrast
Comparison: Chest radiograph 11/27/2020.

CLINICAL DATA: Chest pain and dizziness.

EXAM:
CHEST - 2 VIEW

[w pa chest]
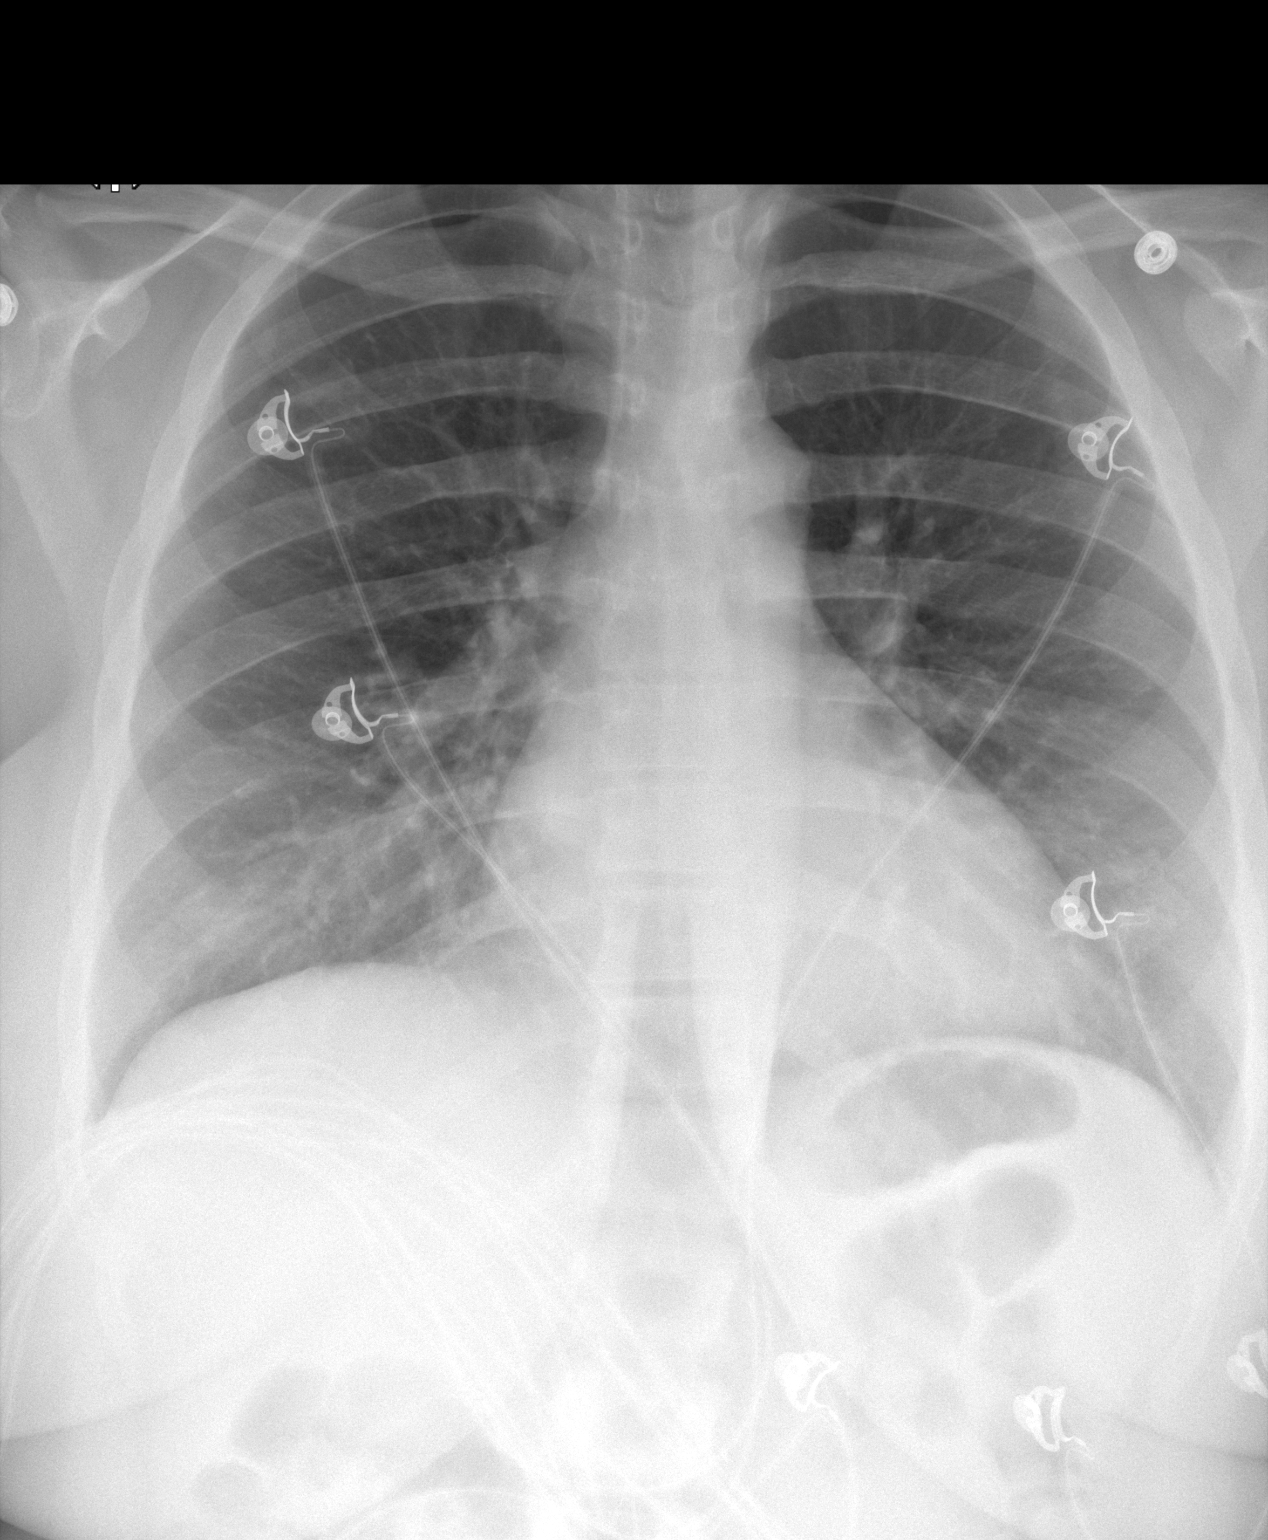

[w chest lat]
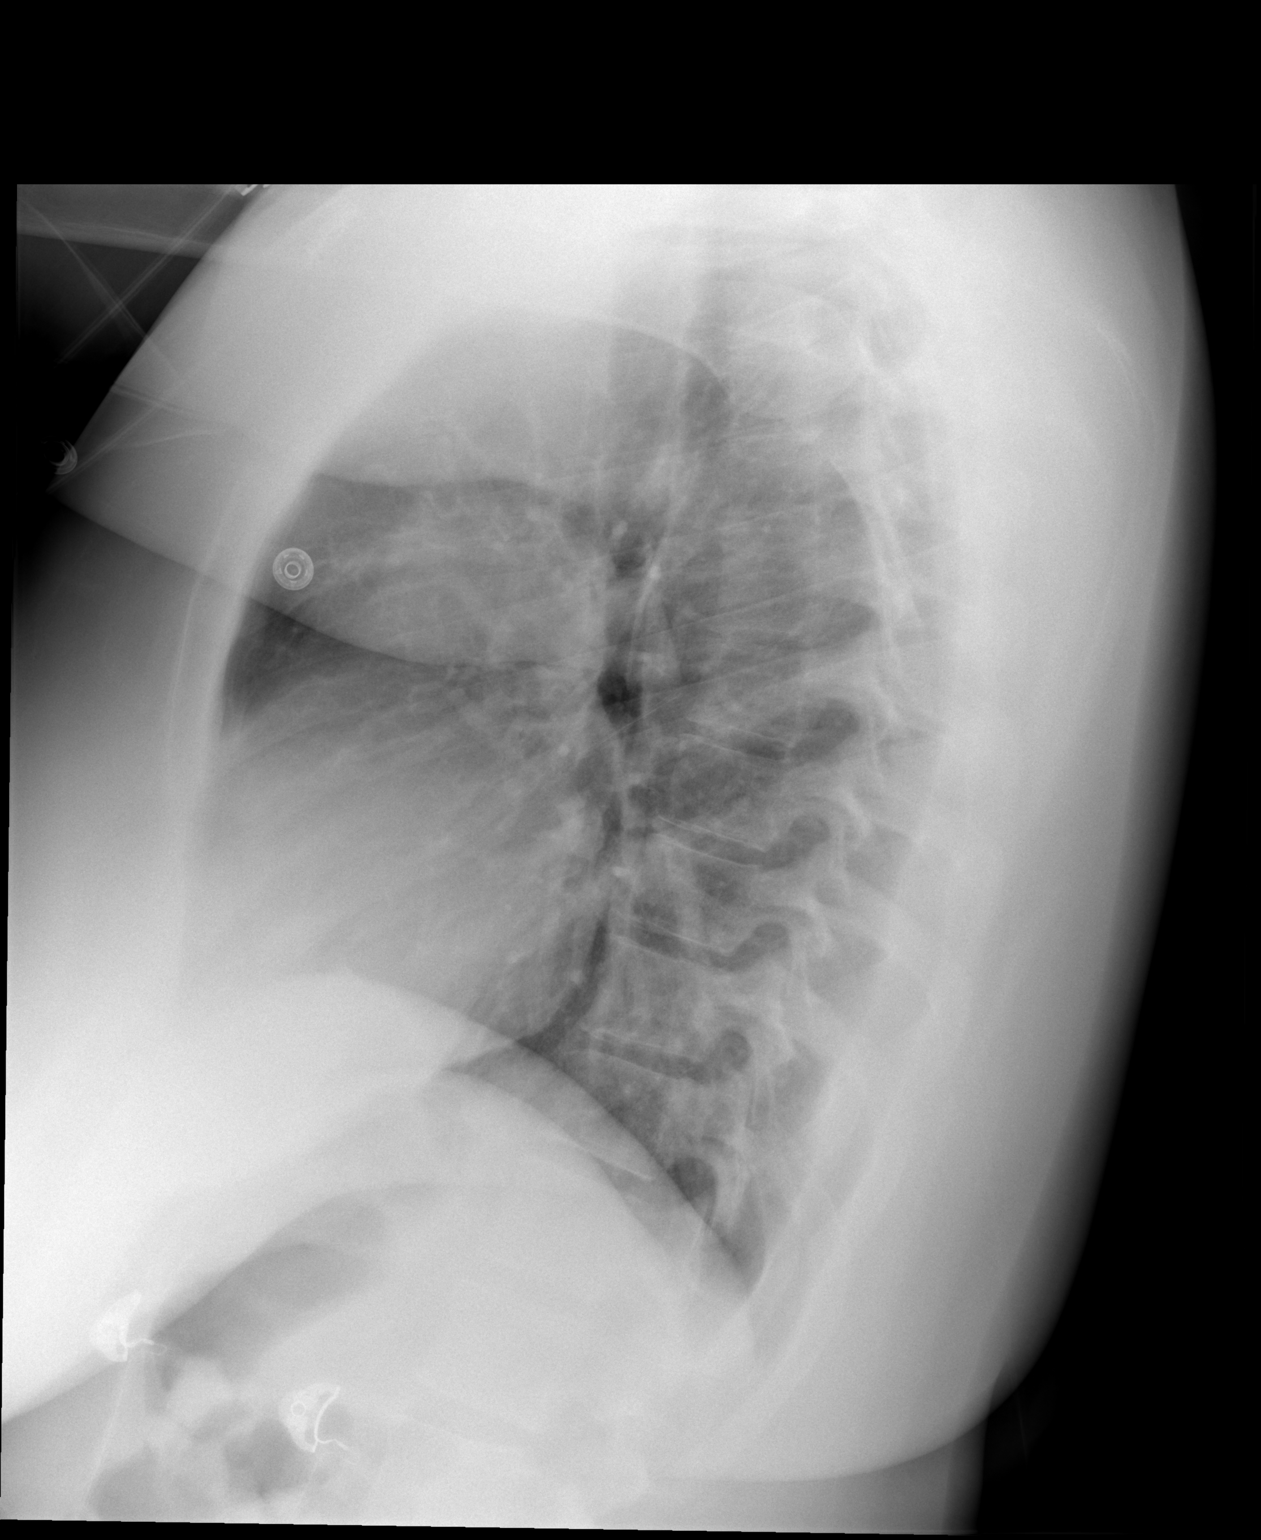

[2 of 2 positions shown; findings below may reference images not displayed]

FINDINGS: Stable cardiac and mediastinal contours. Minimal heterogeneous
opacities right lung base. No pleural effusion or pneumothorax.
Osseous structures unremarkable.
IMPRESSION: Minimal heterogeneous opacities right lung base may represent
atelectasis or infection.

## 2023-02-20 ENCOUNTER — Ambulatory Visit
Admission: EM | Admit: 2023-02-20 | Discharge: 2023-02-20 | Disposition: A | Discharge status: Home / Self Care | Payer: Medicaid Other | Attending: Nurse Practitioner | Admitting: Nurse Practitioner

## 2023-02-20 DIAGNOSIS — F410 Panic disorder [episodic paroxysmal anxiety] without agoraphobia: Secondary | ICD-10-CM | POA: Diagnosis not present

## 2023-02-20 DIAGNOSIS — Z8659 Personal history of other mental and behavioral disorders: Secondary | ICD-10-CM | POA: Diagnosis not present

## 2023-02-20 DIAGNOSIS — J309 Allergic rhinitis, unspecified: Secondary | ICD-10-CM | POA: Diagnosis not present

## 2023-02-20 MED ORDER — CETIRIZINE HCL 10 MG PO TABS: 10.0000 mg | ORAL_TABLET | Freq: Every day | ORAL | 0 refills | Status: DC

## 2023-02-20 MED ORDER — FLUTICASONE PROPIONATE 50 MCG/ACT NA SUSP: 2.0000 | Freq: Every day | NASAL | 0 refills | Status: AC

## 2023-02-20 NOTE — Discharge Instructions (Addendum)
Take medication as prescribed. Increase fluids and allow for plenty of rest. May use over-the-counter Tylenol or Ibuprofen as needed for pain, fever, or general discomfort. Normal saline nasal spray throughout the day for congestion and runny nose. Recommend deep breathing with increased anxiety or panic.  Also recommend relaxation techniques such as reading a book, or going for a walk . If you develop worsening shortness of breath, numbness and tingling in your arms, feeling like you are having difficulty swallowing please follow-up in the emergency department immediately. I am providing you with information for a primary care physician to establish care.  Please call their office to schedule an appointment. Follow-up as needed.

## 2023-02-20 NOTE — ED Provider Notes (Signed)
[facility]    CSN: [id number] Arrival date & time: 02/20/23  0957      History   Chief Complaint No chief complaint on file.   HPI Mikayla Jordan is a 22 y.o. female.   The history is provided by the patient.   The patient presents for complaints of sore throat, postnasal drainage, feeling like her ears are clogged, tightness in her neck, shortness of breath, difficulty swallowing, and tingling in her arms.  Patient states symptoms started yesterday after she had swept outside and swept some pollen.  She denies any new exposures such as new medications, foods, lance, lotions, detergents, or other causes.  Patient reports that she does have a history of anxiety with tach.  She states that she feels that her shortness of breath, difficulty swallowing, and numbness and tingling in her arms is likely due to the panic attack.  She also endorsed palpitations at that time.  She denies those symptoms currently.  She states that she does have a history of seasonal allergies, but does not take any medication.  She denies any other medical history.  Patient denies history of hypertension.  She currently does not have a primary care.  Having  History reviewed. No pertinent past medical history.  There are no problems to display for this patient.   Past Surgical History:  Procedure Laterality Date   BREAST SURGERY     TONSILLECTOMY      OB History   No obstetric history on file.      Home Medications    Prior to Admission medications   Medication Sig Start Date End Date Taking? Authorizing Provider  cetirizine (ZYRTEC) 10 MG tablet Take 1 tablet (10 mg total) by mouth daily. 02/20/23  Yes Kamaree Wheatley-Warren, Sadie Haber, NP  fluticasone (FLONASE) 50 MCG/ACT nasal spray Place 2 sprays into both nostrils daily. 02/20/23  Yes Malika Demario-Warren, Sadie Haber, NP  lisinopril (ZESTRIL) 10 MG tablet Take 1 tablet (10 mg total) by mouth daily. 11/10/21   Bethann Berkshire, MD    Family  History History reviewed. No pertinent family history.  Social History Social History   Tobacco Use   Smoking status: Never   Smokeless tobacco: Never  Vaping Use   Vaping Use: Never used  Substance Use Topics   Alcohol use: Never   Drug use: Never     Allergies   Patient has no known allergies.   Review of Systems Review of Systems Per HPI  Physical Exam Triage Vital Signs ED Triage Vitals  Enc Vitals Group     BP 02/20/23 1004 (!) 155/92     Pulse Rate 02/20/23 1004 (!) 105     Resp 02/20/23 1004 16     Temp 02/20/23 1004 98.9 F (37.2 C)     Temp Source 02/20/23 1004 Oral     SpO2 02/20/23 1004 98 %     Weight --      Height --      Head Circumference --      Peak Flow --      Pain Score 02/20/23 1009 0     Pain Loc --      Pain Edu? --      Excl. in GC? --    No data found.  Updated Vital Signs BP (!) 155/92 (BP Location: Right Arm)   Pulse (!) 105   Temp 98.9 F (37.2 C) (Oral)   Resp 16   LMP 02/13/2023 (Within Days)   SpO2 98%  Visual Acuity Right Eye Distance:   Left Eye Distance:   Bilateral Distance:    Right Eye Near:   Left Eye Near:    Bilateral Near:     Physical Exam Vitals and nursing note reviewed.  Constitutional:      General: She is not in acute distress.    Appearance: Normal appearance.  HENT:     Head: Normocephalic.     Right Ear: Tympanic membrane, ear canal and external ear normal.     Left Ear: Tympanic membrane, ear canal and external ear normal.     Nose: Congestion present.     Right Turbinates: Enlarged and swollen.     Left Turbinates: Enlarged and swollen.     Right Sinus: No maxillary sinus tenderness or frontal sinus tenderness.     Left Sinus: No maxillary sinus tenderness or frontal sinus tenderness.     Mouth/Throat:     Lips: Pink.     Mouth: Mucous membranes are moist.     Pharynx: Oropharynx is clear. Uvula midline. Posterior oropharyngeal erythema present. No pharyngeal swelling.      Comments: Cobblestoning on posterior oropharynyx Eyes:     Extraocular Movements: Extraocular movements intact.     Conjunctiva/sclera: Conjunctivae normal.     Pupils: Pupils are equal, round, and reactive to light.  Cardiovascular:     Rate and Rhythm: Regular rhythm. Tachycardia present.     Pulses: Normal pulses.     Heart sounds: Normal heart sounds.  Pulmonary:     Effort: Pulmonary effort is normal. No respiratory distress.     Breath sounds: Normal breath sounds. No stridor. No wheezing, rhonchi or rales.  Abdominal:     General: Bowel sounds are normal.     Palpations: Abdomen is soft.     Tenderness: There is no abdominal tenderness.  Musculoskeletal:     Cervical back: Normal range of motion.  Lymphadenopathy:     Cervical: No cervical adenopathy.  Skin:    General: Skin is warm and dry.  Neurological:     General: No focal deficit present.     Mental Status: She is alert and oriented to person, place, and time.  Psychiatric:        Mood and Affect: Mood normal.        Behavior: Behavior normal.      UC Treatments / Results  Labs (all labs ordered are listed, but only abnormal results are displayed) Labs Reviewed - No data to display  EKG   Radiology No results found.  Procedures Procedures (including critical care time)  Medications Ordered in UC Medications - No data to display  Initial Impression / Assessment and Plan / UC Course  I have reviewed the triage vital signs and the nursing notes.  Pertinent labs & imaging results that were available during my care of the patient were reviewed by me and considered in my medical decision making (see chart for details).  The patient is well-appearing, she is in no acute distress, vital signs are stable.  Suspect allergic rhinitis.  Patient's symptoms also appear to be consistent with simple anxiety/panic attack.  Zyrtec 10 mg prescribed for seasonal allergies, and fluticasone 50 mcg nasal spray was also  prescribed for allergies, and prevention of allergen triggers.  Suspect the shortness of breath, difficulty swallowing, and numbness and tingling was caused by the panic attack or anxiety.  Patient states that she does not take any medication for anxiety at this time as it is "  not that bad".  Patient was given supportive care recommendations to include use of normal saline nasal congestion nose, over-the-counter analgesics for pain to decrease.  Patient was given information for local [place] family practice office for patient to establish care.  Her BP was elevated today as well.  She was also given strict precautions.  Patient is in agreement with this plan of care and verbalizes understanding.  All questions were answered.  Patient stable for discharge.  Final Clinical Impressions(s) / UC Diagnoses   Final diagnoses:  Panic attack  History of anxiety  Allergic rhinitis, unspecified seasonality, unspecified trigger     Discharge Instructions      Take medication as prescribed. Increase fluids and allow for plenty of rest. May use over-the-counter Tylenol or Ibuprofen as needed for pain, fever, or general discomfort. Normal saline nasal spray throughout the day for congestion and runny nose. Recommend deep breathing with increased anxiety or panic.  Also recommend relaxation techniques such as reading a book, or going for a walk . If you develop worsening shortness of breath, numbness and tingling in your arms, feeling like you are having difficulty swallowing please follow-up in the emergency department immediately. I am providing you with information for a primary care physician to establish care.  Please call their office to schedule an appointment. Follow-up as needed.     ED Prescriptions     Medication Sig Dispense Auth. Provider   fluticasone (FLONASE) 50 MCG/ACT nasal spray Place 2 sprays into both nostrils daily. 16 g Kim Oki-Warren, Sadie Haber, NP   cetirizine (ZYRTEC) 10 MG  tablet Take 1 tablet (10 mg total) by mouth daily. 30 tablet Cherica Heiden-Warren, Sadie Haber, NP      PDMP not reviewed this encounter.   Abran Cantor, NP 02/20/23 1055

## 2023-02-20 NOTE — ED Triage Notes (Signed)
Pt c/o allergic reaction, sore throat, tightness around neck and back of head clogged ears, SOB,difficulty swallowing, tingling in arms x 1 day    Pt states she assumes pollen triggered symptoms , nothing was done out of the ordinary.

## 2023-04-16 ENCOUNTER — Emergency Department (HOSPITAL_COMMUNITY)
Admission: EM | Admit: 2023-04-16 | Discharge: 2023-04-16 | Disposition: A | Discharge status: Home / Self Care | Payer: Medicaid Other | Attending: Emergency Medicine | Admitting: Emergency Medicine

## 2023-04-16 ENCOUNTER — Other Ambulatory Visit: Payer: Self-pay

## 2023-04-16 ENCOUNTER — Emergency Department (HOSPITAL_COMMUNITY): Payer: Medicaid Other

## 2023-04-16 DIAGNOSIS — I10 Essential (primary) hypertension: Secondary | ICD-10-CM | POA: Insufficient documentation

## 2023-04-16 DIAGNOSIS — N132 Hydronephrosis with renal and ureteral calculous obstruction: Secondary | ICD-10-CM | POA: Diagnosis not present

## 2023-04-16 DIAGNOSIS — N200 Calculus of kidney: Secondary | ICD-10-CM

## 2023-04-16 DIAGNOSIS — R1031 Right lower quadrant pain: Secondary | ICD-10-CM | POA: Diagnosis present

## 2023-04-16 LAB — POC URINE PREG, ED: Preg Test, Ur: NEGATIVE

## 2023-04-16 LAB — COMPREHENSIVE METABOLIC PANEL
ALT: 17 U/L (ref 0–44)
AST: 22 U/L (ref 15–41)
Albumin: 3.9 g/dL (ref 3.5–5.0)
Alkaline Phosphatase: 52 U/L (ref 38–126)
Anion gap: 7 (ref 5–15)
BUN: 12 mg/dL (ref 6–20)
CO2: 26 mmol/L (ref 22–32)
Calcium: 9.3 mg/dL (ref 8.9–10.3)
Chloride: 106 mmol/L (ref 98–111)
Creatinine, Ser: 1.27 mg/dL — ABNORMAL HIGH (ref 0.44–1.00)
GFR, Estimated: >60 mL/min (ref >60)
Glucose, Bld: 101 mg/dL — ABNORMAL HIGH (ref 70–99)
Potassium: 3.6 mmol/L (ref 3.5–5.1)
Sodium: 139 mmol/L (ref 135–145)
Total Bilirubin: 0.6 mg/dL (ref 0.3–1.2)
Total Protein: 7.2 g/dL (ref 6.5–8.1)

## 2023-04-16 LAB — CBC
HCT: 35.5 % — ABNORMAL LOW (ref 36.0–46.0)
Hemoglobin: 10.7 g/dL — ABNORMAL LOW (ref 12.0–15.0)
MCH: 23.9 pg — ABNORMAL LOW (ref 26.0–34.0)
MCHC: 30.1 g/dL (ref 30.0–36.0)
MCV: 79.4 fL — ABNORMAL LOW (ref 80.0–100.0)
Platelets: 264 10*3/uL (ref 150–400)
RBC: 4.47 MIL/uL (ref 3.87–5.11)
RDW: 17.6 % — ABNORMAL HIGH (ref 11.5–15.5)
WBC: 8.7 10*3/uL (ref 4.0–10.5)
nRBC: 0 % (ref 0.0–0.2)

## 2023-04-16 LAB — URINALYSIS, ROUTINE W REFLEX MICROSCOPIC
Bilirubin Urine: NEGATIVE
Glucose, UA: NEGATIVE mg/dL
Hgb urine dipstick: NEGATIVE
Ketones, ur: NEGATIVE mg/dL
Leukocytes,Ua: NEGATIVE
Nitrite: NEGATIVE
Protein, ur: NEGATIVE mg/dL
Specific Gravity, Urine: 1.008 (ref 1.005–1.030)
pH: 7 (ref 5.0–8.0)

## 2023-04-16 LAB — LIPASE, BLOOD: Lipase: 34 U/L (ref 11–51)

## 2023-04-16 MED ORDER — TAMSULOSIN HCL 0.4 MG PO CAPS: 0.4000 mg | ORAL_CAPSULE | Freq: Every day | ORAL | 0 refills | Status: DC

## 2023-04-16 MED ORDER — IOHEXOL 300 MG/ML  SOLN
100.0000 mL | Freq: Once | INTRAMUSCULAR | Status: AC | PRN
  Administered 2023-04-16: 100 mL via INTRAVENOUS

## 2023-04-16 MED ORDER — HYDROCODONE-ACETAMINOPHEN 5-325 MG PO TABS: 1.0000 | ORAL_TABLET | Freq: Four times a day (QID) | ORAL | 0 refills | Status: DC | PRN

## 2023-04-16 MED ORDER — SODIUM CHLORIDE 0.9 % IV BOLUS
1000.0000 mL | Freq: Once | INTRAVENOUS | Status: AC
  Administered 2023-04-16: 1000 mL via INTRAVENOUS

## 2023-04-16 MED ORDER — KETOROLAC TROMETHAMINE 30 MG/ML IJ SOLN
30.0000 mg | Freq: Once | INTRAMUSCULAR | Status: AC
  Administered 2023-04-16: 30 mg via INTRAVENOUS
  Filled 2023-04-16: qty 1

## 2023-04-16 NOTE — Discharge Instructions (Signed)
Take ibuprofen 600 mg every 6 hours as needed for pain.  Begin taking hydrocodone as needed for pain not relieved with ibuprofen.  Begin taking Flomax as prescribed.  Follow-up with urology in the next 1 to 2 days.  The contact information for Dr. Ronne Binning has been provided in this discharge summary for you to call and make these arrangements.

## 2023-04-16 NOTE — ED Notes (Signed)
ED Provider at bedside. 

## 2023-04-16 NOTE — ED Triage Notes (Signed)
Pt c/o RLQ pain x1 week, now feels like it is radiating to lower back. Denies any urinary symptoms.

## 2023-04-16 NOTE — ED Provider Notes (Signed)
[place] EM[facility]GENC[facility] DEPARTMENT AT [facility] Provider Note   CSN: [id number] Arrival date & time: 04/16/23  0146     History  Chief Complaint  Patient presents with   Abdominal Pain    Mikayla Jordan is a 22 y.o. female.  Patient is a 22 year old female with history of hypertension.  Patient presenting today with complaints of right lower quadrant pain.  She describes a discomfort in the right lower abdomen that has been coming and going for the past week.  She denies any fevers or chills.  She denies any bowel or bladder complaints.  Pain can worsen when she takes a deep breath and sometimes when she eats and is relieved when she puts pressure on the area.  Last menstrual period was the beginning of May, but denies the possibility of pregnancy.  The history is provided by the patient.       Home Medications Prior to Admission medications   Medication Sig Start Date End Date Taking? Authorizing Provider  cetirizine (ZYRTEC) 10 MG tablet Take 1 tablet (10 mg total) by mouth daily. 02/20/23   Leath-Warren, Sadie Haber, NP  fluticasone (FLONASE) 50 MCG/ACT nasal spray Place 2 sprays into both nostrils daily. 02/20/23   Leath-Warren, Sadie Haber, NP  lisinopril (ZESTRIL) 10 MG tablet Take 1 tablet (10 mg total) by mouth daily. 11/10/21   Bethann Berkshire, MD      Allergies    Patient has no known allergies.    Review of Systems   Review of Systems  All other systems reviewed and are negative.   Physical Exam Updated Vital Signs BP (!) 155/116   Pulse 81   Temp 98.5 F (36.9 C)   Resp 20   Ht 5\' 4"  (1.626 m)   Wt 95.5 kg   LMP 03/26/2023 (Approximate)   SpO2 99%   BMI 36.14 kg/m  Physical Exam Vitals and nursing note reviewed.  Constitutional:      General: She is not in acute distress.    Appearance: She is well-developed. She is not diaphoretic.  HENT:     Head: Normocephalic and atraumatic.  Cardiovascular:     Rate and Rhythm: Normal rate and regular  rhythm.     Heart sounds: No murmur heard.    No friction rub. No gallop.  Pulmonary:     Effort: Pulmonary effort is normal. No respiratory distress.     Breath sounds: Normal breath sounds. No wheezing.  Abdominal:     General: Bowel sounds are normal. There is no distension.     Palpations: Abdomen is soft.     Tenderness: There is no abdominal tenderness.  Musculoskeletal:        General: Normal range of motion.     Cervical back: Normal range of motion and neck supple.  Skin:    General: Skin is warm and dry.  Neurological:     General: No focal deficit present.     Mental Status: She is alert and oriented to person, place, and time.     ED Results / Procedures / Treatments   Labs (all labs ordered are listed, but only abnormal results are displayed) Labs Reviewed  CBC - Abnormal; Notable for the following components:      Result Value   Hemoglobin 10.7 (*)    HCT 35.5 (*)    MCV 79.4 (*)    MCH 23.9 (*)    RDW 17.6 (*)    All other components within normal limits  URINALYSIS, ROUTINE W REFLEX MICROSCOPIC - Abnormal; Notable for the following components:   Color, Urine STRAW (*)    All other components within normal limits  LIPASE, BLOOD  COMPREHENSIVE METABOLIC PANEL  POC URINE PREG, ED    EKG None  Radiology No results found.  Procedures Procedures    Medications Ordered in ED Medications - No data to display  ED Course/ Medical Decision Making/ A&P  Patient is a 22 year old female presenting with complaints of right-sided abdominal pain.  This has been coming and going for the past week.  She arrives here with stable vital signs and is afebrile.  Physical examination basically unremarkable.  Her abdomen is benign.  Laboratory studies showed no leukocytosis, no elevation of liver or pancreatic enzymes, and no electrolyte derangement.  Pregnancy test is negative and urinalysis is clear.  CT scan obtained showing a 5 mm calculus in the mid ureter with  obstructive uropathy.  Patient has been given Toradol here in the ER and seems to be feeling better.  She will be discharged with hydrocodone and Flomax and is to follow-up with urology.  Final Clinical Impression(s) / ED Diagnoses Final diagnoses:  None    Rx / DC Orders ED Discharge Orders     None         Geoffery Lyons, MD 04/16/23 [phone]

## 2023-04-17 ENCOUNTER — Encounter: Payer: Self-pay | Admitting: Urology

## 2023-04-17 ENCOUNTER — Ambulatory Visit (HOSPITAL_COMMUNITY)
Admission: RE | Admit: 2023-04-17 | Discharge: 2023-04-17 | Disposition: A | Discharge status: Home / Self Care | Payer: Medicaid Other | Source: Ambulatory Visit | Attending: Urology | Admitting: Urology

## 2023-04-17 ENCOUNTER — Ambulatory Visit: Payer: Medicaid Other | Admitting: Urology

## 2023-04-17 VITALS — BP 128/83 | HR 86 | Ht 64.0 in | Wt 210.0 lb

## 2023-04-17 DIAGNOSIS — N201 Calculus of ureter: Secondary | ICD-10-CM | POA: Diagnosis not present

## 2023-04-17 DIAGNOSIS — N200 Calculus of kidney: Secondary | ICD-10-CM | POA: Insufficient documentation

## 2023-04-17 LAB — URINALYSIS, ROUTINE W REFLEX MICROSCOPIC
Bilirubin, UA: NEGATIVE
Glucose, UA: NEGATIVE
Ketones, UA: NEGATIVE
Nitrite, UA: NEGATIVE
Protein,UA: NEGATIVE
RBC, UA: NEGATIVE
Specific Gravity, UA: 1.025 (ref 1.005–1.030)
Urobilinogen, Ur: 0.2 mg/dL (ref 0.2–1.0)
pH, UA: 6 (ref 5.0–7.5)

## 2023-04-17 LAB — MICROSCOPIC EXAMINATION

## 2023-04-17 MED ORDER — TAMSULOSIN HCL 0.4 MG PO CAPS: 0.4000 mg | ORAL_CAPSULE | Freq: Every day | ORAL | 0 refills | Status: DC

## 2023-04-17 MED ORDER — HYDROCODONE-ACETAMINOPHEN 5-325 MG PO TABS: 1.0000 | ORAL_TABLET | Freq: Four times a day (QID) | ORAL | 0 refills | Status: AC | PRN

## 2023-04-17 NOTE — Progress Notes (Signed)
04/17/2023 9:12 AM   Lenn Sink [birth year] [id number]  Referring provider: No referring provider defined for this encounter.  nephrolithiasis   HPI: Ms Basinger is a 21yo here for evaluation of nephrolithiasis. Starting 1 week ago she developed right flank pain with associated nausea. No vomiting. She presented to ER Monday was was diagnosed with 5 mm right proximal ureteral calculus. This is her first stone event. No family history of nephrolithiasis. Flank pain is mild currently. No nausea or vomiting. No other complaints today   PMH: History reviewed. No pertinent past medical history.  Surgical History: Past Surgical History:  Procedure Laterality Date   BREAST SURGERY     TONSILLECTOMY      Home Medications:  Allergies as of 04/17/2023   No Known Allergies      Medication List        Accurate as of April 17, 2023  9:12 AM. If you have any questions, ask your nurse or doctor.          cetirizine 10 MG tablet Commonly known as: ZYRTEC Take 1 tablet (10 mg total) by mouth daily.   fluticasone 50 MCG/ACT nasal spray Commonly known as: FLONASE Place 2 sprays into both nostrils daily.   HYDROcodone-acetaminophen 5-325 MG tablet Commonly known as: NORCO/VICODIN Take 1-2 tablets by mouth every 6 (six) hours as needed.   lisinopril 10 MG tablet Commonly known as: ZESTRIL Take 1 tablet (10 mg total) by mouth daily.   tamsulosin 0.4 MG Caps capsule Commonly known as: FLOMAX Take 1 capsule (0.4 mg total) by mouth daily.        Allergies: No Known Allergies  Family History: History reviewed. No pertinent family history.  Social History:  reports that she has never smoked. She has never used smokeless tobacco. She reports that she does not drink alcohol and does not use drugs.  ROS: All other review of systems were reviewed and are negative except what is noted above in HPI  Physical Exam: BP 128/83   Pulse 86   Ht 5\' 4"  (1.626 m)   Wt 210 lb  (95.3 kg)   LMP 03/26/2023 (Approximate)   BMI 36.05 kg/m   Constitutional:  Alert and oriented, No acute distress. HEENT: [place] AT, moist mucus membranes.  Trachea midline, no masses. Cardiovascular: No clubbing, cyanosis, or edema. Respiratory: Normal respiratory effort, no increased work of breathing. GI: Abdomen is soft, nontender, nondistended, no abdominal masses GU: No CVA tenderness.  Lymph: No cervical or inguinal lymphadenopathy. Skin: No rashes, bruises or suspicious lesions. Neurologic: Grossly intact, no focal deficits, moving all 4 extremities. Psychiatric: Normal mood and affect.  Laboratory Data: Lab Results  Component Value Date   WBC 8.7 04/16/2023   HGB 10.7 (L) 04/16/2023   HCT 35.5 (L) 04/16/2023   MCV 79.4 (L) 04/16/2023   PLT 264 04/16/2023    Lab Results  Component Value Date   CREATININE 1.27 (H) 04/16/2023    No results found for: "PSA"  No results found for: "TESTOSTERONE"  No results found for: "HGBA1C"  Urinalysis    Component Value Date/Time   COLORURINE STRAW (A) 04/16/2023 0211   APPEARANCEUR CLEAR 04/16/2023 0211   LABSPEC 1.008 04/16/2023 0211   PHURINE 7.0 04/16/2023 0211   GLUCOSEU NEGATIVE 04/16/2023 0211   HGBUR NEGATIVE 04/16/2023 0211   BILIRUBINUR NEGATIVE 04/16/2023 0211   KETONESUR NEGATIVE 04/16/2023 0211   PROTEINUR NEGATIVE 04/16/2023 0211   NITRITE NEGATIVE 04/16/2023 0211   LEUKOCYTESUR NEGATIVE 04/16/2023 0211  No results found for: "LABMICR", "WBCUA", "RBCUA", "LABEPIT", "MUCUS", "BACTERIA"  Pertinent Imaging: Ct 6/11 and KUB today: Images reviewed and discussed with the patient  No results found for this or any previous visit.  No results found for this or any previous visit.  No results found for this or any previous visit.  No results found for this or any previous visit.  No results found for this or any previous visit.  No valid procedures specified. No results found for this or any previous  visit.  No results found for this or any previous visit.   Assessment & Plan:    1. Kidney stones -We discussed the management of kidney stones. These options include observation, ureteroscopy, shockwave lithotripsy (ESWL) and percutaneous nephrolithotomy (PCNL). We discussed which options are relevant to the patient's stone(s). We discussed the natural history of kidney stones as well as the complications of untreated stones and the impact on quality of life without treatment as well as with each of the above listed treatments. We also discussed the efficacy of each treatment in its ability to clear the stone burden. With any of these management options I discussed the signs and symptoms of infection and the need for emergent treatment should these be experienced. For each option we discussed the ability of each procedure to clear the patient of their stone burden.   For observation I described the risks which include but are not limited to silent renal damage, life-threatening infection, need for emergent surgery, failure to pass stone and pain.   For ureteroscopy I described the risks which include bleeding, infection, damage to contiguous structures, positioning injury, ureteral stricture, ureteral avulsion, ureteral injury, need for prolonged ureteral stent, inability to perform ureteroscopy, need for an interval procedure, inability to clear stone burden, stent discomfort/pain, heart attack, stroke, pulmonary embolus and the inherent risks with general anesthesia.   For shockwave lithotripsy I described the risks which include arrhythmia, kidney contusion, kidney hemorrhage, need for transfusion, pain, inability to adequately break up stone, inability to pass stone fragments, Steinstrasse, infection associated with obstructing stones, need for alternate surgical procedure, need for repeat shockwave lithotripsy, MI, CVA, PE and the inherent risks with anesthesia/conscious sedation.   For PCNL I  described the risks including positioning injury, pneumothorax, hydrothorax, need for chest tube, inability to clear stone burden, renal laceration, arterial venous fistula or malformation, need for embolization of kidney, loss of kidney or renal function, need for repeat procedure, need for prolonged nephrostomy tube, ureteral avulsion, MI, CVA, PE and the inherent risks of general anesthesia.   - The patient would like to proceed with medical expulsive therapy - Urinalysis, Routine w reflex microscopic   No follow-ups on file.  Wilkie Aye, MD  [facility] [place]

## 2023-04-23 ENCOUNTER — Encounter: Payer: Self-pay | Admitting: Urology

## 2023-04-23 NOTE — Patient Instructions (Signed)

## 2023-04-29 DIAGNOSIS — N200 Calculus of kidney: Secondary | ICD-10-CM | POA: Insufficient documentation

## 2023-04-29 NOTE — Progress Notes (Unsigned)
Mikayla Jordan DOB: 07-31-2001 MRN: [id number]  History of Present Illness: [name] is a 22 y.o. female who presents today for follow up visit at [facility]. - GU History: 1. Kidney stone.  At last visit with Dr. Ronne Binning on 04/17/2023: Seen for follow up after being diagnosed with 5 mm right proximal ureteral calculus in ER on 04/16/2023. This is her first stone event. She elected to proceed with medical expulsive therapy.   Today: KUB today: Awaiting radiology read; 5 mm right proximal ureteral appears to have progressed only slightly.   She reports some ongoing intermittent RLQ abdominal pain. Denies flank pain. She denies fevers or nausea/ vomiting.  She denies increased urinary urgency, frequency, dysuria, gross hematuria, straining to void, or sensations of incomplete emptying.   Fall Screening: Do you usually have a device to assist in your mobility? No   Medications: Current Outpatient Medications  Medication Sig Dispense Refill   fluticasone (FLONASE) 50 MCG/ACT nasal spray Place 2 sprays into both nostrils daily. 16 g 0   ibuprofen (ADVIL) 100 MG tablet Take 100 mg by mouth every 6 (six) hours as needed for fever.     tamsulosin (FLOMAX) 0.4 MG CAPS capsule Take 1 capsule (0.4 mg total) by mouth daily. 30 capsule 0   HYDROcodone-acetaminophen (NORCO/VICODIN) 5-325 MG tablet Take 1-2 tablets by mouth every 6 (six) hours as needed. (Patient not taking: Reported on 05/02/2023) 30 tablet 0   No current facility-administered medications for this visit.    Allergies: No Known Allergies  History reviewed. No pertinent past medical history. Past Surgical History:  Procedure Laterality Date   BREAST SURGERY     TONSILLECTOMY     History reviewed. No pertinent family history. Social History   Socioeconomic History   Marital status: Single    Spouse name: Not on file   Number of children: Not on file   Years of education: Not on file   Highest  education level: Not on file  Occupational History   Not on file  Tobacco Use   Smoking status: Never   Smokeless tobacco: Never  Vaping Use   Vaping Use: Never used  Substance and Sexual Activity   Alcohol use: Never   Drug use: Never   Sexual activity: Not on file  Other Topics Concern   Not on file  Social History Narrative   Not on file   Social Determinants of Health   Financial Resource Strain: Not on file  Food Insecurity: Not on file  Transportation Needs: Not on file  Physical Activity: Not on file  Stress: Not on file  Social Connections: Not on file  Intimate Partner Violence: Not on file    SUBJECTIVE  Review of Systems Constitutional: Patient denies any unintentional weight loss or change in strength lntegumentary: Patient denies any rashes or pruritus Eyes: Patient denies dry eyes ENT: Patient denies dry mouth Cardiovascular: Patient denies chest pain or syncope Respiratory: Patient denies shortness of breath Gastrointestinal: Patient denies nausea, vomiting, constipation, or diarrhea Musculoskeletal: Patient denies muscle cramps or weakness Neurologic: Patient denies convulsions or seizures Psychiatric: Patient denies memory problems Allergic/Immunologic: Patient denies recent allergic reaction(s) Hematologic/Lymphatic: Patient denies bleeding tendencies Endocrine: Patient denies heat/cold intolerance  GU: As per HPI.  OBJECTIVE Vitals:   05/02/23 1014  BP: 126/87  Pulse: (!) 106  Temp: 98.1 F (36.7 C)   There is no height or weight on file to calculate BMI.  Physical Examination  Constitutional: No obvious distress; patient  is non-toxic appearing  Cardiovascular: No visible lower extremity edema.  Respiratory: The patient does not have audible wheezing/stridor; respirations do not appear labored  Gastrointestinal: Abdomen non-distended Musculoskeletal: Normal ROM of UEs  Skin: No obvious rashes/open sores  Neurologic: CN 2-12 grossly  intact Psychiatric: Answered questions appropriately with normal affect  Hematologic/Lymphatic/Immunologic: No obvious bruises or sites of spontaneous bleeding  UA: positive for 6-10, bacteria (few)  ASSESSMENT Kidney stones - Plan: Urinalysis, Routine w reflex microscopic, US RENAL, DG Abd 1 View  Right ureteral stone  We reviewed recent imaging results; awaiting radiology results but appears to have only mild progression of 5 mm right proximal ureteral stone.   She is minimally symptomatic at this time. We discussed options including another 2 weeks of medical expulsive therapy with Flomax versus requesting surgery posting for extracorporeal shock wave lithotripsy (ESWL) with Dr. Ronne Binning. We discussed that procedure in detail along with possible risks and benefits including but not limited to: including pain, infection, sepsis, UTI, ureter perforation, need for stenting, post-op ureteral stricture, hematuria, anesthesia complications. With ESWL, the risk for skin bruising or hematoma formation was also discussed. She elected to continue with medical expulsive therapy for another 2 weeks. Advised adequate hydration, which may also help facilitate stone passage.  For pain management, we discussed the use of opioids versus OTC analgesics. In general, advised use of OTC Ibuprofen (Motrin / Advil) every 8 hours as needed to minimize pain and inflammation due stone and to reserve use of narcotic pain medication primarily only for severe breakthrough pain as needed.   Will plan to follow up in 2 weeks with KUB and RUS to recheck stone or sooner if needed.  Pt verbalized understanding and agreement. All questions were answered.  PLAN Advised the following: Maintain adequate fluid intake. Continue Flomax 0.4 mg daily. Return in about 2 weeks (around 05/16/2023) for RUS, KUB, UA, & f/u with Evette Georges NP.  Orders Placed This Encounter  Procedures   US RENAL    Standing Status:   Future     Standing Expiration Date:   05/01/2024    Order Specific Question:   Reason for Exam (SYMPTOM  OR DIAGNOSIS REQUIRED)    Answer:   kidney stone known or suspected    Order Specific Question:   Preferred imaging location?    Answer:   [facility]   DG Abd 1 View    Standing Status:   Future    Standing Expiration Date:   05/01/2024    Order Specific Question:   Reason for Exam (SYMPTOM  OR DIAGNOSIS REQUIRED)    Answer:   kidney stone    Order Specific Question:   Is patient pregnant?    Answer:   Unknown (Please Explain)    Order Specific Question:   Preferred imaging location?    Answer:   [facility]   Urinalysis, Routine w reflex microscopic    It has been explained that the patient is to follow regularly with their PCP in addition to all other providers involved in their care and to follow instructions provided by these respective offices. Patient advised to contact urology clinic if any urologic-pertaining questions, concerns, new symptoms or problems arise in the interim period.  There are no Patient Instructions on file for this visit.  Electronically signed by:  Donnita Falls, MSN, FNP-C, CUNP 05/02/2023 12:58 PM

## 2023-05-01 ENCOUNTER — Telehealth: Payer: Self-pay

## 2023-05-01 NOTE — Telephone Encounter (Signed)
Patient is made aware to get KUB done before scheduled appointment. Patient voiced understanding.

## 2023-05-02 ENCOUNTER — Ambulatory Visit (HOSPITAL_COMMUNITY)
Admission: RE | Admit: 2023-05-02 | Discharge: 2023-05-02 | Disposition: A | Discharge status: Home / Self Care | Payer: Medicaid Other | Source: Ambulatory Visit | Attending: Urology | Admitting: Urology

## 2023-05-02 ENCOUNTER — Encounter: Payer: Self-pay | Admitting: Urology

## 2023-05-02 ENCOUNTER — Ambulatory Visit: Payer: Medicaid Other | Admitting: Urology

## 2023-05-02 VITALS — BP 126/87 | HR 106 | Temp 98.1°F

## 2023-05-02 DIAGNOSIS — N200 Calculus of kidney: Secondary | ICD-10-CM | POA: Insufficient documentation

## 2023-05-02 DIAGNOSIS — N201 Calculus of ureter: Secondary | ICD-10-CM

## 2023-05-02 LAB — URINALYSIS, ROUTINE W REFLEX MICROSCOPIC
Bilirubin, UA: NEGATIVE
Glucose, UA: NEGATIVE
Ketones, UA: NEGATIVE
Nitrite, UA: NEGATIVE
Protein,UA: NEGATIVE
RBC, UA: NEGATIVE
Specific Gravity, UA: 1.025 (ref 1.005–1.030)
Urobilinogen, Ur: 0.2 mg/dL (ref 0.2–1.0)
pH, UA: 5.5 (ref 5.0–7.5)

## 2023-05-02 LAB — MICROSCOPIC EXAMINATION: Epithelial Cells (non renal): >10 /hpf — AB (ref 0–10)

## 2023-05-13 ENCOUNTER — Encounter (HOSPITAL_COMMUNITY)
Admission: RE | Admit: 2023-05-13 | Discharge: 2023-05-13 | Disposition: A | Discharge status: Home / Self Care | Payer: Medicaid Other | Source: Ambulatory Visit | Attending: Urology | Admitting: Urology

## 2023-05-13 ENCOUNTER — Other Ambulatory Visit: Payer: Self-pay

## 2023-05-13 ENCOUNTER — Encounter (HOSPITAL_COMMUNITY): Payer: Self-pay

## 2023-05-13 NOTE — Pre-Procedure Instructions (Signed)
Attempted pre-op phonecall. Left VM for her to call us back. 

## 2023-05-14 ENCOUNTER — Encounter (HOSPITAL_COMMUNITY)
Admission: RE | Disposition: A | Discharge status: Home / Self Care | Payer: Self-pay | Source: Home / Self Care | Attending: Urology

## 2023-05-14 ENCOUNTER — Ambulatory Visit (HOSPITAL_COMMUNITY)
Admission: RE | Admit: 2023-05-14 | Discharge: 2023-05-14 | Disposition: A | Discharge status: Home / Self Care | Payer: Medicaid Other | Attending: Urology | Admitting: Urology

## 2023-05-14 ENCOUNTER — Other Ambulatory Visit: Payer: Self-pay

## 2023-05-14 ENCOUNTER — Encounter (HOSPITAL_COMMUNITY): Payer: Self-pay | Admitting: Urology

## 2023-05-14 DIAGNOSIS — N201 Calculus of ureter: Secondary | ICD-10-CM | POA: Diagnosis present

## 2023-05-14 DIAGNOSIS — Z01818 Encounter for other preprocedural examination: Secondary | ICD-10-CM

## 2023-05-14 DIAGNOSIS — Z538 Procedure and treatment not carried out for other reasons: Secondary | ICD-10-CM | POA: Insufficient documentation

## 2023-05-14 LAB — POCT PREGNANCY, URINE: Preg Test, Ur: NEGATIVE

## 2023-05-14 SURGERY — LITHOTRIPSY, ESWL
Anesthesia: LOCAL | Laterality: Right

## 2023-05-14 MED ORDER — DIPHENHYDRAMINE HCL 25 MG PO CAPS
25.0000 mg | ORAL_CAPSULE | ORAL | Status: AC
  Administered 2023-05-14: 25 mg via ORAL

## 2023-05-14 MED ORDER — SODIUM CHLORIDE 0.9 % IV SOLN: INTRAVENOUS | Status: DC

## 2023-05-14 MED ORDER — DIPHENHYDRAMINE HCL 25 MG PO CAPS
ORAL_CAPSULE | ORAL | Status: AC
  Filled 2023-05-14: qty 1

## 2023-05-14 MED ORDER — DIAZEPAM 5 MG PO TABS
ORAL_TABLET | ORAL | Status: AC
  Filled 2023-05-14: qty 2

## 2023-05-14 MED ORDER — DIAZEPAM 5 MG PO TABS
10.0000 mg | ORAL_TABLET | Freq: Once | ORAL | Status: AC
  Administered 2023-05-14: 10 mg via ORAL

## 2023-05-14 NOTE — Progress Notes (Signed)
Stone to close to the kidney. Unable to do lithotripsy today.  Patient advised not to drive until tomorrow because she did have 25mg  benadryl and 10mg  valium PO before procedure. No other drugs given. IV d/c, pressure dressing applied and patient dressed and awaiting her ride at this time.

## 2023-05-14 NOTE — OR Nursing (Signed)
Patient stated she took advil yesterday just before midnight .  Dr. Ronne Binning notified. Patient went to truck they are going to Xray her there to see if able to perform procedure.

## 2023-05-17 ENCOUNTER — Encounter (HOSPITAL_COMMUNITY)
Admission: RE | Admit: 2023-05-17 | Discharge: 2023-05-17 | Disposition: A | Discharge status: Home / Self Care | Payer: Medicaid Other | Source: Ambulatory Visit | Attending: Urology | Admitting: Urology

## 2023-05-17 VITALS — Ht 64.0 in | Wt 210.1 lb

## 2023-05-17 DIAGNOSIS — Z01818 Encounter for other preprocedural examination: Secondary | ICD-10-CM

## 2023-05-20 ENCOUNTER — Telehealth: Payer: Self-pay

## 2023-05-20 ENCOUNTER — Ambulatory Visit (HOSPITAL_COMMUNITY): Admission: RE | Admit: 2023-05-20 | Payer: Medicaid Other | Source: Ambulatory Visit

## 2023-05-20 NOTE — OR Nursing (Signed)
Patient called to cancel procedure due to transportation issue.  Stated she will call office.

## 2023-05-20 NOTE — Telephone Encounter (Signed)
Patient needs to cancel ESWL for tomorrow 05/21/2023 Patient will not have anyone to accompany patient for surgery.  Patient does wish to keep scheduled post op appointment.

## 2023-05-20 NOTE — Telephone Encounter (Signed)
Please see below.  Can you cancel litho for tomorrow please.

## 2023-05-21 ENCOUNTER — Ambulatory Visit (HOSPITAL_COMMUNITY): Admission: RE | Admit: 2023-05-21 | Payer: Medicaid Other | Source: Home / Self Care | Admitting: Urology

## 2023-05-21 ENCOUNTER — Encounter (HOSPITAL_COMMUNITY): Admission: RE | Payer: Self-pay | Source: Home / Self Care

## 2023-05-21 DIAGNOSIS — N201 Calculus of ureter: Secondary | ICD-10-CM

## 2023-05-21 SURGERY — LITHOTRIPSY, ESWL
Anesthesia: LOCAL | Laterality: Right

## 2023-05-24 ENCOUNTER — Encounter: Payer: Medicaid Other | Admitting: Urology

## 2023-05-31 ENCOUNTER — Ambulatory Visit
Admission: EM | Admit: 2023-05-31 | Discharge: 2023-05-31 | Disposition: A | Discharge status: Home / Self Care | Payer: Medicaid Other | Attending: Family Medicine | Admitting: Family Medicine

## 2023-05-31 DIAGNOSIS — K0889 Other specified disorders of teeth and supporting structures: Secondary | ICD-10-CM

## 2023-05-31 MED ORDER — CHLORHEXIDINE GLUCONATE 0.12 % MT SOLN: 15.0000 mL | Freq: Two times a day (BID) | OROMUCOSAL | 0 refills | Status: AC

## 2023-05-31 MED ORDER — AMOXICILLIN-POT CLAVULANATE 875-125 MG PO TABS: 1.0000 | ORAL_TABLET | Freq: Two times a day (BID) | ORAL | 0 refills | Status: AC

## 2023-05-31 NOTE — ED Triage Notes (Signed)
Pt reports she has some back right side dental pain and something is moving in the right side of her jaw.

## 2023-05-31 NOTE — ED Provider Notes (Signed)
[facility]    CSN: [id number] Arrival date & time: 05/31/23  1650      History   Chief Complaint No chief complaint on file.   HPI Mikayla Jordan is a 22 y.o. female.   Patient presenting today with right upper posterior dental pain that has started the past day or so.  Also feels a swollen area under the right side of the jaw.  Denies injury to the tooth, fever, chills, drainage, difficulty swallowing or breathing.  So far not tried anything over-the-counter for symptoms.    History reviewed. No pertinent past medical history.  Patient Active Problem List   Diagnosis Date Noted   Right ureteral stone 05/02/2023   Kidney stones 04/29/2023    Past Surgical History:  Procedure Laterality Date   BREAST SURGERY Left    removal of benign breast   TONSILLECTOMY      OB History   No obstetric history on file.      Home Medications    Prior to Admission medications   Medication Sig Start Date End Date Taking? Authorizing Provider  amoxicillin-clavulanate (AUGMENTIN) 875-125 MG tablet Take 1 tablet by mouth every 12 (twelve) hours. 05/31/23  Yes Particia Nearing, PA-C  chlorhexidine (PERIDEX) 0.12 % solution Use as directed 15 mLs in the mouth or throat 2 (two) times daily. 05/31/23  Yes Particia Nearing, PA-C  fluticasone [facility]) 50 MCG/ACT nasal spray Place 2 sprays into both nostrils daily. 02/20/23   Leath-Warren, Sadie Haber, NP  HYDROcodone-acetaminophen (NORCO/VICODIN) 5-325 MG tablet Take 1-2 tablets by mouth every 6 (six) hours as needed. Patient not taking: Reported on 05/02/2023 04/17/23   Malen Gauze, MD  ibuprofen (ADVIL) 100 MG tablet Take 100 mg by mouth every 6 (six) hours as needed for fever.    [provider]  tamsulosin (FLOMAX) 0.4 MG CAPS capsule Take 1 capsule (0.4 mg total) by mouth daily. 04/17/23   McKenzie, Mardene Celeste, MD    Family History History reviewed. No pertinent family history.  Social  History Social History   Tobacco Use   Smoking status: Never   Smokeless tobacco: Never  Vaping Use   Vaping status: Never Used  Substance Use Topics   Alcohol use: Never   Drug use: Never     Allergies   Patient has no known allergies.   Review of Systems Review of Systems Per HPI  Physical Exam Triage Vital Signs ED Triage Vitals  Encounter Vitals Group     BP 05/31/23 1656 (!) 157/116     Systolic BP Percentile --      Diastolic BP Percentile --      Pulse Rate 05/31/23 1656 98     Resp 05/31/23 1656 18     Temp 05/31/23 1656 99.7 F (37.6 C)     Temp Source 05/31/23 1655 Oral     SpO2 05/31/23 1656 96 %     Weight --      Height --      Head Circumference --      Peak Flow --      Pain Score 05/31/23 1657 0     Pain Loc --      Pain Education --      Exclude from Growth Chart --    No data found.  Updated Vital Signs BP (!) 157/116 (BP Location: Right Arm)   Pulse 98   Temp 99.7 F (37.6 C) (Oral)   Resp 18   LMP 05/08/2023 (  Approximate)   SpO2 96%   Visual Acuity Right Eye Distance:   Left Eye Distance:   Bilateral Distance:    Right Eye Near:   Left Eye Near:    Bilateral Near:     Physical Exam Vitals and nursing note reviewed.  Constitutional:      Appearance: Normal appearance. She is not ill-appearing.  HENT:     Head: Atraumatic.     Mouth/Throat:     Mouth: Mucous membranes are moist.     Pharynx: No oropharyngeal exudate or posterior oropharyngeal erythema.     Comments: Mild gingival erythema to the posterior right upper side.  No active drainage, bleeding, abscess appreciated Eyes:     Extraocular Movements: Extraocular movements intact.     Conjunctiva/sclera: Conjunctivae normal.  Cardiovascular:     Rate and Rhythm: Normal rate and regular rhythm.     Heart sounds: Normal heart sounds.  Pulmonary:     Effort: Pulmonary effort is normal.     Breath sounds: Normal breath sounds.  Musculoskeletal:        General:  Normal range of motion.     Cervical back: Normal range of motion and neck supple.  Lymphadenopathy:     Cervical: No cervical adenopathy.  Skin:    General: Skin is warm and dry.  Neurological:     Mental Status: She is alert and oriented to person, place, and time.  Psychiatric:        Mood and Affect: Mood normal.        Thought Content: Thought content normal.        Judgment: Judgment normal.      UC Treatments / Results  Labs (all labs ordered are listed, but only abnormal results are displayed) Labs Reviewed - No data to display  EKG   Radiology No results found.  Procedures Procedures (including critical care time)  Medications Ordered in UC Medications - No data to display  Initial Impression / Assessment and Plan / UC Course  I have reviewed the triage vital signs and the nursing notes.  Pertinent labs & imaging results that were available during my care of the patient were reviewed by me and considered in my medical decision making (see chart for details).     Cover for infection with Peridex rinse, Augmentin and discussed over-the-counter pain relievers, close dental follow-up.  Return for worsening symptoms.  Final Clinical Impressions(s) / UC Diagnoses   Final diagnoses:  Pain, dental   Discharge Instructions   None    ED Prescriptions     Medication Sig Dispense Auth. Provider   chlorhexidine (PERIDEX) 0.12 % solution Use as directed 15 mLs in the mouth or throat 2 (two) times daily. 120 mL Particia Nearing, PA-C   amoxicillin-clavulanate (AUGMENTIN) 875-125 MG tablet Take 1 tablet by mouth every 12 (twelve) hours. 14 tablet Particia Nearing, [place]      PDMP not reviewed this encounter.   Particia Nearing, [place] 05/31/23 1919

## 2023-06-03 ENCOUNTER — Telehealth: Payer: Self-pay

## 2023-06-03 ENCOUNTER — Ambulatory Visit (HOSPITAL_COMMUNITY)
Admission: RE | Admit: 2023-06-03 | Discharge: 2023-06-03 | Disposition: A | Discharge status: Home / Self Care | Payer: Medicaid Other | Source: Ambulatory Visit | Attending: Urology | Admitting: Urology

## 2023-06-03 DIAGNOSIS — N200 Calculus of kidney: Secondary | ICD-10-CM | POA: Diagnosis present

## 2023-06-03 NOTE — Telephone Encounter (Signed)
Patient is made aware to get KUB and renal ultrasound done before scheduled appointment with Maralyn Sago. Patient voiced understanding.

## 2023-06-03 NOTE — Progress Notes (Deleted)
Name: Mikayla Jordan DOB: 11-Jun-2001 MRN: [id number]  Diagnoses: 1) Post-operative state  HPI: Emilene Starrett presents post-operatively s/p right ESWL procedure on 05/14/2023 by Dr. Ronne Binning for management of a right proximal ureteral stone.  Postop course: KUB today: Awaiting radiology read; ***  Today She reports***  Pt reports urinating *** per day. Pt reports *** urinary stream. Pt {Actions; denies-reports:120008} urgency. Pt {Actions; denies-reports:120008} dysuria. Pt {Actions; denies-reports:120008}  gross hematuria. Pt {Actions; denies-reports:120008}  the need to strain to void. Pt {Actions; denies-reports:120008}  sensations of incomplete emptying.  Pt {Actions; denies-reports:120008}  flank pain. Pt {Actions; denies-reports:120008}  abdominal pain.   Fall Screening: Do you usually have a device to assist in your mobility? {yes/no:20286} ***cane / ***walker / ***wheelchair   Medications: Current Outpatient Medications  Medication Sig Dispense Refill   amoxicillin-clavulanate (AUGMENTIN) 875-125 MG tablet Take 1 tablet by mouth every 12 (twelve) hours. 14 tablet 0   chlorhexidine (PERIDEX) 0.12 % solution Use as directed 15 mLs in the mouth or throat 2 (two) times daily. 120 mL 0   fluticasone (FLONASE) 50 MCG/ACT nasal spray Place 2 sprays into both nostrils daily. 16 g 0   HYDROcodone-acetaminophen (NORCO/VICODIN) 5-325 MG tablet Take 1-2 tablets by mouth every 6 (six) hours as needed. (Patient not taking: Reported on 05/02/2023) 30 tablet 0   ibuprofen (ADVIL) 100 MG tablet Take 100 mg by mouth every 6 (six) hours as needed for fever.     tamsulosin (FLOMAX) 0.4 MG CAPS capsule Take 1 capsule (0.4 mg total) by mouth daily. 30 capsule 0   No current facility-administered medications for this visit.    Allergies: No Known Allergies  No past medical history on file. Past Surgical History:  Procedure Laterality Date   BREAST SURGERY Left    removal of benign breast    TONSILLECTOMY     No family history on file. Social History   Socioeconomic History   Marital status: Single    Spouse name: Not on file   Number of children: Not on file   Years of education: Not on file   Highest education level: Not on file  Occupational History   Not on file  Tobacco Use   Smoking status: Never   Smokeless tobacco: Never  Vaping Use   Vaping status: Never Used  Substance and Sexual Activity   Alcohol use: Never   Drug use: Never   Sexual activity: Yes  Other Topics Concern   Not on file  Social History Narrative   Not on file   Social Determinants of Health   Financial Resource Strain: Not on file  Food Insecurity: Not on file  Transportation Needs: Not on file  Physical Activity: Not on file  Stress: Not on file  Social Connections: Not on file  Intimate Partner Violence: Not on file    SUBJECTIVE  Review of Systems Constitutional: Patient ***denies any unintentional weight loss or change in strength lntegumentary: Patient ***denies any rashes or pruritus Eyes: Patient denies ***dry eyes ENT: Patient ***denies dry mouth Cardiovascular: Patient ***denies chest pain or syncope Respiratory: Patient ***denies shortness of breath Gastrointestinal: Patient ***denies nausea, vomiting, constipation, or diarrhea Musculoskeletal: Patient ***denies muscle cramps or weakness Neurologic: Patient ***denies convulsions or seizures Psychiatric: Patient ***denies memory problems Allergic/Immunologic: Patient ***denies recent allergic reaction(s) Hematologic/Lymphatic: Patient denies bleeding tendencies Endocrine: Patient ***denies heat/cold intolerance  GU: As per HPI.  OBJECTIVE There were no vitals filed for this visit. There is no height or weight on file to calculate  BMI.  Physical Examination  Constitutional: ***No obvious distress; patient is ***non-toxic appearing  Cardiovascular: ***No visible lower extremity edema.  Respiratory: The  patient does ***not have audible wheezing/stridor; respirations do ***not appear labored  Gastrointestinal: Abdomen ***non-distended Musculoskeletal: ***Normal ROM of UEs  Skin: ***No obvious rashes/open sores  Neurologic: CN 2-12 grossly ***intact Psychiatric: Answered questions ***appropriately with ***normal affect  Hematologic/Lymphatic/Immunologic: ***No obvious bruises or sites of spontaneous bleeding  UA: {Desc; negative/positive:13464} for *** WBC/hpf, *** RBC/hpf, bacteria (***) PVR: *** ml  ASSESSMENT No diagnosis found.  We reviewed the operative procedures and findings.  She is doing ***well. Pre-operative symptoms are *** since the procedure.  We reviewed recent imaging results; ***awaiting radiology results, appears to have ***no acute findings.  ***For stone prevention: Advised adequate hydration and we discussed option to consider low oxalate diet given that calcium oxalate is the most common type of stone. Handout provided about stone prevention diet.  ***For recurrent stone formers: We discussed option to proceed with 24 hour urinalysis ([facility]) for metabolic evaluation, which may help with targeted recommendations for dietary I medication therapies for stone prevention. Patient elected to ***proceed/ ***hold off.  Will plan to follow up in ***6 months / ***1 year with KUB ***RUS for stone surveillance or sooner if needed.  Pt verbalized understanding and agreement. All questions were answered.  PLAN Advised the following: ***Maintain adequate fluid intake. ***Low oxalate diet. No follow-ups on file.  No orders of the defined types were placed in this encounter.   It has been explained that the patient is to follow regularly with their PCP in addition to all other providers involved in their care and to follow instructions provided by these respective offices. Patient advised to contact urology clinic if any urologic-pertaining questions, concerns, new symptoms  or problems arise in the interim period.  There are no Patient Instructions on file for this visit.  Electronically signed by:  Donnita Falls, MSN, FNP-C, CUNP 06/03/2023 12:54 PM

## 2023-06-04 ENCOUNTER — Ambulatory Visit: Payer: Medicaid Other | Admitting: Urology

## 2023-06-04 DIAGNOSIS — N200 Calculus of kidney: Secondary | ICD-10-CM

## 2023-06-04 DIAGNOSIS — Z09 Encounter for follow-up examination after completed treatment for conditions other than malignant neoplasm: Secondary | ICD-10-CM

## 2023-06-04 DIAGNOSIS — N201 Calculus of ureter: Secondary | ICD-10-CM

## 2023-06-09 NOTE — Progress Notes (Signed)
Please let patient know that KUB showed no stones in the kidneys and the right ureteral stone was "not definitively seen on the current exam."

## 2023-06-13 ENCOUNTER — Emergency Department (HOSPITAL_COMMUNITY)
Admission: EM | Admit: 2023-06-13 | Discharge: 2023-06-14 | Disposition: A | Discharge status: Home / Self Care | Payer: Medicaid Other | Attending: Emergency Medicine | Admitting: Emergency Medicine

## 2023-06-13 ENCOUNTER — Encounter (HOSPITAL_COMMUNITY): Payer: Self-pay | Admitting: Emergency Medicine

## 2023-06-13 ENCOUNTER — Other Ambulatory Visit: Payer: Self-pay

## 2023-06-13 DIAGNOSIS — W260XXA Contact with knife, initial encounter: Secondary | ICD-10-CM | POA: Diagnosis not present

## 2023-06-13 DIAGNOSIS — Z23 Encounter for immunization: Secondary | ICD-10-CM | POA: Diagnosis not present

## 2023-06-13 DIAGNOSIS — Y93G1 Activity, food preparation and clean up: Secondary | ICD-10-CM | POA: Diagnosis not present

## 2023-06-13 DIAGNOSIS — S61412A Laceration without foreign body of left hand, initial encounter: Secondary | ICD-10-CM

## 2023-06-13 NOTE — ED Triage Notes (Signed)
Pt arrives via EMS after she was cutting some brownies with a knife and accidentally cut her hand. Per EMS, pt has an approximately 1 inch laceration to palm of her L hand. Bleeding controlled at this time.

## 2023-06-14 MED ORDER — TETANUS-DIPHTH-ACELL PERTUSSIS 5-2.5-18.5 LF-MCG/0.5 IM SUSY
0.5000 mL | PREFILLED_SYRINGE | Freq: Once | INTRAMUSCULAR | Status: AC
  Administered 2023-06-14: 0.5 mL via INTRAMUSCULAR
  Filled 2023-06-14: qty 0.5

## 2023-06-14 MED ORDER — LIDOCAINE-EPINEPHRINE (PF) 2 %-1:200000 IJ SOLN
10.0000 mL | Freq: Once | INTRAMUSCULAR | Status: AC
  Administered 2023-06-14: 10 mL
  Filled 2023-06-14: qty 20

## 2023-06-14 NOTE — ED Provider Notes (Signed)
[place] EMERGENCY DEPARTMENT AT [facility]  Provider Note  CSN: [id number] Arrival date & time: 06/13/23 2320  History Chief Complaint  Patient presents with   Laceration    Mikayla Jordan is a 22 y.o. left handed female reports she cut her L palm with a knife at home just prior to arrival. Washed it in the sink prior to calling EMS. TDAP >5 years ago.    Home Medications Prior to Admission medications   Medication Sig Start Date End Date Taking? Authorizing Provider  amoxicillin-clavulanate (AUGMENTIN) 875-125 MG tablet Take 1 tablet by mouth every 12 (twelve) hours. 05/31/23   Particia Nearing, PA-C  chlorhexidine (PERIDEX) 0.12 % solution Use as directed 15 mLs in the mouth or throat 2 (two) times daily. 05/31/23   Particia Nearing, PA-C  fluticasone [facility]) 50 MCG/ACT nasal spray Place 2 sprays into both nostrils daily. 02/20/23   Leath-Warren, Sadie Haber, NP  HYDROcodone-acetaminophen (NORCO/VICODIN) 5-325 MG tablet Take 1-2 tablets by mouth every 6 (six) hours as needed. Patient not taking: Reported on 05/02/2023 04/17/23   Malen Gauze, MD  ibuprofen (ADVIL) 100 MG tablet Take 100 mg by mouth every 6 (six) hours as needed for fever.    [provider]  tamsulosin (FLOMAX) 0.4 MG CAPS capsule Take 1 capsule (0.4 mg total) by mouth daily. 04/17/23   McKenzie, Mardene Celeste, MD     Allergies    Patient has no known allergies.   Review of Systems   Review of Systems Please see HPI for pertinent positives and negatives  Physical Exam BP (!) 133/95   Pulse 94   Temp (!) 100.5 F (38.1 C) (Oral)   Resp 20   Ht 5\' 4"  (1.626 m)   Wt 95.3 kg   LMP 05/08/2023 (Approximate)   SpO2 96%   BMI 36.05 kg/m   Physical Exam Vitals and nursing note reviewed.  HENT:     Head: Normocephalic.     Nose: Nose normal.  Eyes:     Extraocular Movements: Extraocular movements intact.  Pulmonary:     Effort: Pulmonary effort is normal.   Musculoskeletal:        General: Normal range of motion.     Cervical back: Neck supple.     Comments: 3cm laceration over the thenar eminence of the L palm  Skin:    Findings: No rash (on exposed skin).  Neurological:     Mental Status: She is alert and oriented to person, place, and time.  Psychiatric:        Mood and Affect: Mood normal.     ED Results / Procedures / Treatments   EKG None  Procedures .Marland KitchenLaceration Repair  Date/Time: 06/14/2023 1:01 AM  Performed by: Pollyann Savoy, MD Authorized by: Pollyann Savoy, MD   Consent:    Consent obtained:  Verbal   Consent given by:  Patient Anesthesia:    Anesthesia method:  Local infiltration   Local anesthetic:  Lidocaine 2% WITH epi Laceration details:    Location:  Hand   Hand location:  L palm   Length (cm):  3 Pre-procedure details:    Preparation:  Patient was prepped and draped in usual sterile fashion Exploration:    Imaging outcome: foreign body not noted     Wound exploration: wound explored through full range of motion and entire depth of wound visualized   Treatment:    Area cleansed with:  Povidone-iodine   Amount of cleaning:  Standard   Irrigation solution:  Sterile water   Irrigation method:  Syringe   Debridement:  Minimal (subcutaneous fat) Skin repair:    Repair method:  Sutures   Suture size:  5-0   Suture material:  Nylon   Suture technique:  Simple interrupted   Number of sutures:  4 Approximation:    Approximation:  Close Repair type:    Repair type:  Simple Post-procedure details:    Dressing:  Non-adherent dressing   Procedure completion:  Tolerated well, no immediate complications   Medications Ordered in the ED Medications  lidocaine-EPINEPHrine (XYLOCAINE W/EPI) 2 %-1:200000 (PF) injection 10 mL (10 mLs Infiltration Given 06/14/23 0042)  Tdap (BOOSTRIX) injection 0.5 mL (0.5 mLs Intramuscular Given 06/14/23 0019)    Initial Impression and Plan  Patient here with  uncomplicated laceration, repaired as above. TDAP updated, wound care instructions given. Patient has fever on arrival, but denies any other infectious symptoms. Recently had a kidney stone, but denies any pain or urinary symptoms.   ED Course       MDM Rules/Calculators/A&P Medical Decision Making Problems Addressed: Laceration of left hand without foreign body, initial encounter: acute illness or injury  Risk Prescription drug management.     Final Clinical Impression(s) / ED Diagnoses Final diagnoses:  Laceration of left hand without foreign body, initial encounter    Rx / DC Orders ED Discharge Orders     None        Pollyann Savoy, MD 06/14/23 [id number]

## 2023-06-14 NOTE — ED Notes (Signed)
MD in room at this time for Lac repair

## 2023-06-25 NOTE — Progress Notes (Unsigned)
Name: Mikayla Jordan DOB: 03-Jul-2001 MRN: [id number]  HPI: Mikayla Jordan returns for follow up visit. - GU history: 1. Kidney stones.   She was scheduled for right ESWL on 05/14/2023 for right proximal ureteral stone but that was canceled (states she was told "stone was too close to the kidney"). She states that stone seemed to pass at some point later in July 2024.   06/03/2023: KUB showed no stones in the kidneys and the right ureteral stone was "not definitively seen on the current exam."  She reports that this Monday 06/24/2023 something else was noted to pass in her urine. She is having some mild dull ache ("not pain") in the right side / flank area today. She denies increased urinary urgency, frequency, nocturia, dysuria, gross hematuria, hesitancy, straining to void, or sensations of incomplete emptying.   Fall Screening: Do you usually have a device to assist in your mobility? No   Medications: Current Outpatient Medications  Medication Sig Dispense Refill   fluticasone (FLONASE) 50 MCG/ACT nasal spray Place 2 sprays into both nostrils daily. 16 g 0   ibuprofen (ADVIL) 100 MG tablet Take 100 mg by mouth every 6 (six) hours as needed for fever.     tamsulosin (FLOMAX) 0.4 MG CAPS capsule Take 1 capsule by mouth daily as needed for stone symptoms. Advised to contact urology provider / request office visit if stone symptoms fail to resolve. Go to ER if symptoms become severe. 30 capsule 1   amoxicillin-clavulanate (AUGMENTIN) 875-125 MG tablet Take 1 tablet by mouth every 12 (twelve) hours. (Patient not taking: Reported on 06/27/2023) 14 tablet 0   chlorhexidine (PERIDEX) 0.12 % solution Use as directed 15 mLs in the mouth or throat 2 (two) times daily. (Patient not taking: Reported on 06/27/2023) 120 mL 0   HYDROcodone-acetaminophen (NORCO/VICODIN) 5-325 MG tablet Take 1-2 tablets by mouth every 6 (six) hours as needed. (Patient not taking: Reported on 05/02/2023) 30 tablet 0   No  current facility-administered medications for this visit.    Allergies: No Known Allergies  History reviewed. No pertinent past medical history. Past Surgical History:  Procedure Laterality Date   BREAST SURGERY Left    removal of benign breast   TONSILLECTOMY     History reviewed. No pertinent family history. Social History   Socioeconomic History   Marital status: Single    Spouse name: Not on file   Number of children: Not on file   Years of education: Not on file   Highest education level: Not on file  Occupational History   Not on file  Tobacco Use   Smoking status: Never   Smokeless tobacco: Never  Vaping Use   Vaping status: Never Used  Substance and Sexual Activity   Alcohol use: Never   Drug use: Never   Sexual activity: Yes  Other Topics Concern   Not on file  Social History Narrative   Not on file   Social Determinants of Health   Financial Resource Strain: Not on file  Food Insecurity: Not on file  Transportation Needs: Not on file  Physical Activity: Not on file  Stress: Not on file  Social Connections: Not on file  Intimate Partner Violence: Not on file    SUBJECTIVE  Review of Systems Constitutional: Patient denies any unintentional weight loss or change in strength lntegumentary: Patient denies any rashes or pruritus Cardiovascular: Patient denies chest pain or syncope Respiratory: Patient denies shortness of breath Gastrointestinal: Patient denies nausea, vomiting, constipation, or diarrhea  Musculoskeletal: Patient denies muscle cramps or weakness Neurologic: Patient denies convulsions or seizures Psychiatric: Patient denies memory problems Allergic/Immunologic: Patient denies recent allergic reaction(s) Hematologic/Lymphatic: Patient denies bleeding tendencies Endocrine: Patient denies heat/cold intolerance  GU: As per HPI.  OBJECTIVE Vitals:   06/27/23 1602  BP: 121/85  Pulse: 80  Temp: 98.7 F (37.1 C)   There is no height  or weight on file to calculate BMI.  Physical Examination  Constitutional: No obvious distress; patient is non-toxic appearing  Cardiovascular: No visible lower extremity edema.  Respiratory: The patient does not have audible wheezing/stridor; respirations do not appear labored  Gastrointestinal: Abdomen non-distended Musculoskeletal: Normal ROM of UEs  Skin: No obvious rashes/open sores  Neurologic: CN 2-12 grossly intact Psychiatric: Answered questions appropriately with normal affect  Hematologic/Lymphatic/Immunologic: No obvious bruises or sites of spontaneous bleeding  UA: positive for >30 RBC/hpf  ASSESSMENT Kidney stones - Plan: Urinalysis, Routine w reflex microscopic, CT RENAL STONE STUDY, DG Abd 1 View, tamsulosin (FLOMAX) 0.4 MG CAPS capsule  Postop check - Plan: Urinalysis, Routine w reflex microscopic  Microhematuria - Plan: CT RENAL STONE STUDY  Right upper quadrant abdominal pain - Plan: CT RENAL STONE STUDY  We agreed to obtain CT stone within 1 week for further evaluation based on history / current stone symptoms and microscopic hematuria on today's UA. We discussed likely recent stone passage as the cause for her symptoms with suspected mild ureteral inflammation / colic, which is expected to resolve with time. Flomax given for PRN use for medical expulsive therapy if/when stone symptoms occur. Discussed OTC analgesics PRN. As long as no acute findings are noted on CT, will plan to follow up in 6 months / with KUB for stone surveillance or sooner if needed.  Pt verbalized understanding and agreement. All questions were answered.  PLAN Advised the following: CT stone within 1 week.  Flomax PRN for stone symptoms.  Maintain adequate fluid intake. Return in about 6 months (around 12/28/2023) for KUB, UA, & f/u with Evette Georges NP.  Orders Placed This Encounter  Procedures   CT RENAL STONE STUDY    Standing Status:   Future    Standing Expiration Date:   06/26/2024     Order Specific Question:   Is patient pregnant?    Answer:   No    Order Specific Question:   Preferred imaging location?    Answer:   [facility]   DG Abd 1 View    Standing Status:   Future    Standing Expiration Date:   06/26/2024    Order Specific Question:   Reason for Exam (SYMPTOM  OR DIAGNOSIS REQUIRED)    Answer:   kidney stone    Order Specific Question:   Is patient pregnant?    Answer:   Unknown (Please Explain)    Order Specific Question:   Preferred imaging location?    Answer:   [facility]   Urinalysis, Routine w reflex microscopic    It has been explained that the patient is to follow regularly with their PCP in addition to all other providers involved in their care and to follow instructions provided by these respective offices. Patient advised to contact urology clinic if any urologic-pertaining questions, concerns, new symptoms or problems arise in the interim period.  Patient Instructions  >80% of stones are calcium oxalate. This type of stones forms when body either isn't clearing oxalate well enough, is making too much oxalate, or too little citrate. This  results in oxalate binding to form crystals, which continue to aggregate and form stones.  Limiting calcium does not help, but limiting oxalate in the diet can help. Increasing citric acid intake may also help.  The following measures may help to prevent the recurrence of stones: Increase water intake to 2-2.5 liters per day May add citrus juice (lemon, lime or orange juice) to water Moderation in dairy foods Decrease in salt content 5. Low Oxalate diet: Oxylates are found in foods like Tomato, Spinach, red wine and chocolate (see additional resources below).  Internet resources for information regarding low oxalate diet: https://kidneystones.yangchunwu.com https://my.VerticalStretch.be  Foods Low in  Sodium or Oxalate Foods You Can Eat  Drinks Coffee, fruit and veggie juice (using the recommended veggies), fruit punch  Fruits Apples, apricots (fresh or canned), avocado, bananas, cherries (sweet), cranberries, grapefruit, red or green grapes, lemon and lime juice, melons, nectarines, papayas, peaches, pears, pineapples, oranges, strawberries (fresh), tangerines  Veggies Artichokes, asparagus, bamboo shoots, broccoli, brussels sprouts, cabbage, cauliflower, chayote squash, chicory, corn, cucumbers, endive, lettuce, lima beans, mushrooms, onions, peas, peppers, potatoes, radishes, rutabagas, zucchini  Breads, Cereals, Grains Egg noodles, rye bread, cooked and dry cereals without nuts or bran, crackers with unsalted tops, white or wild rice  Meat, Meat Replacements, Fish, Recruitment consultant, fish, poultry, eggs, egg whites, egg replacements  Soup Homemade soup (using the recommended veggies and meat), low-sodium bouillon, low-sodium canned  Desserts Cookies, cakes, ice cream, pudding without chocolate or nuts, candy without chocolate or nuts  Fats and Oils Butter, margarine, cream, oil, salad dressing, mayo  Other Foods Unsalted potato chips or pretzels, herbs (like garlic, garlic powder, onion powder), lemon juice, salt-free seasoning blends, vinegar  Other Foods Low in Oxalate Foods You Can Eat  Drinks Beer, cola, wine, buttermilk, lemonade or limeade (without added vitamin C), milk  Meat, Meat Replacements, Fish, [employer] meat, ham, bacon, hot dogs, bratwurst, sausage, chicken nuggets, cheddar cheese, canned fish and shellfish  Soup Tomato soup, cheese soup  Other Foods Coconuts, lemon or lime juices, sugar or sweeteners, jellies or jams (from the recommended list)   Moderate-Oxalate Foods Foods to Limit   Drinks Fruit and veggie juices (from the list below), chocolate milk, rice milk, hot cocoa, tea   Fruits Blackberries, blueberries, black currants, cherries (sour), fruit cocktail,  mangoes, orange peel, prunes, purple plums   Veggies Baked beans, carrots, celery, green beans, parsnips, summer squash, tomatoes, turnips   Breads, Cereals, Grains White bread, cornbread or cornmeal, white English muffins, saltine or soda crackers, brown rice, vanilla wafers, spaghetti and other noodles, firm tofu, bagels, oatmeal   Meat/meat replacements, fish, poultry Sardines   Desserts Chocolate cake   Fats and Oils Macadamia nuts, pistachio nuts, English walnuts   Other Foods Jams or jellies (made with the fruits above), pepper    High-Oxalate Foods Foods to Avoid Drinks Chocolate drink mixes, soy milk, Ovaltine, instant iced tea, fruit juices of fruits listed below Fruits Apricots (dried), red currants, figs, kiwi, plums, rhubarb Veggies Beans (wax, dried), beets and beet greens, chives, collard greens, eggplant, escarole, dark greens of all kinds, leeks, okra, parsley, rutabagas, spinach, Swiss chard, tomato paste, watercress Breads, Cereals, Grains Amaranth, barley, white corn flour, fried potatoes, fruitcake, grits, soybean products, sweet potatoes, wheat germ and bran, buckwheat flour, All Bran cereal, graham crackers, pretzels, whole wheat bread Meat/meat replacements, fish, poultry  Dried beans, peanut butter, soy burgers, miso  Desserts Carob, chocolate, marmalades Fats and Oils Nuts (peanuts, almonds, pecans, cashews, hazelnuts), nut  butters, sesame seeds, tahini paste Other Foods Poppy seeds   Electronically signed by:  Donnita Falls, MSN, FNP-C, CUNP 06/27/2023 4:29 PM

## 2023-06-27 ENCOUNTER — Ambulatory Visit: Payer: Medicaid Other | Admitting: Urology

## 2023-06-27 ENCOUNTER — Encounter: Payer: Self-pay | Admitting: Urology

## 2023-06-27 VITALS — BP 121/85 | HR 80 | Temp 98.7°F

## 2023-06-27 DIAGNOSIS — Z87442 Personal history of urinary calculi: Secondary | ICD-10-CM

## 2023-06-27 DIAGNOSIS — N200 Calculus of kidney: Secondary | ICD-10-CM

## 2023-06-27 DIAGNOSIS — R1011 Right upper quadrant pain: Secondary | ICD-10-CM | POA: Diagnosis not present

## 2023-06-27 DIAGNOSIS — R3129 Other microscopic hematuria: Secondary | ICD-10-CM | POA: Diagnosis not present

## 2023-06-27 DIAGNOSIS — Z09 Encounter for follow-up examination after completed treatment for conditions other than malignant neoplasm: Secondary | ICD-10-CM

## 2023-06-27 MED ORDER — TAMSULOSIN HCL 0.4 MG PO CAPS
ORAL_CAPSULE | ORAL | 1 refills | Status: AC
Start: 2023-06-27 — End: ?

## 2023-06-27 NOTE — Patient Instructions (Signed)

## 2023-06-28 LAB — URINALYSIS, ROUTINE W REFLEX MICROSCOPIC
Bilirubin, UA: NEGATIVE
Glucose, UA: NEGATIVE
Ketones, UA: NEGATIVE
Leukocytes,UA: NEGATIVE
Nitrite, UA: NEGATIVE
Specific Gravity, UA: 1.02 (ref 1.005–1.030)
Urobilinogen, Ur: 1 mg/dL (ref 0.2–1.0)
pH, UA: 7 (ref 5.0–7.5)

## 2023-06-28 LAB — MICROSCOPIC EXAMINATION
Bacteria, UA: NONE SEEN
RBC, Urine: >30 /HPF — AB (ref 0–2)

## 2023-07-01 ENCOUNTER — Ambulatory Visit (HOSPITAL_COMMUNITY)
Admission: RE | Admit: 2023-07-01 | Discharge: 2023-07-01 | Disposition: A | Discharge status: Home / Self Care | Payer: Medicaid Other | Source: Ambulatory Visit | Attending: Urology | Admitting: Urology

## 2023-07-01 DIAGNOSIS — R1011 Right upper quadrant pain: Secondary | ICD-10-CM | POA: Diagnosis present

## 2023-07-01 DIAGNOSIS — N200 Calculus of kidney: Secondary | ICD-10-CM | POA: Insufficient documentation

## 2023-07-01 DIAGNOSIS — R3129 Other microscopic hematuria: Secondary | ICD-10-CM | POA: Insufficient documentation

## 2023-07-02 ENCOUNTER — Telehealth: Payer: Self-pay

## 2023-07-02 NOTE — Telephone Encounter (Signed)
-----   Message from Donnita Falls sent at 07/02/2023  8:34 AM EDT ----- Please let pt know that CT confirmed stone passage. No GU stones, masses, or hydronephrosis at this time. F/u as planned.

## 2023-07-02 NOTE — Telephone Encounter (Signed)
Patient made aware and voiced understanding.

## 2023-08-08 ENCOUNTER — Telehealth: Payer: Self-pay

## 2023-08-08 DIAGNOSIS — K429 Umbilical hernia without obstruction or gangrene: Secondary | ICD-10-CM | POA: Insufficient documentation

## 2023-08-08 NOTE — Telephone Encounter (Signed)
Tried calling patient with no answer, left vm for return call to office

## 2023-08-08 NOTE — Progress Notes (Deleted)
Name: Mikayla Jordan DOB: Sep 16, 2001 MRN: [id number]  History of Present Illness: Mikayla Jordan is a 22 y.o. female who presents today for follow up visit at [facility] [place]. - GU History: 1. Kidney stones.  Recent history:  > 05/14/2023: Was scheduled for right ESWL for right proximal ureteral stone but that was canceled (states she was told "stone was too close to the kidney"). She states that stone seemed to pass at some point later in July 2024.    > 06/03/2023: KUB showed no stones in the kidneys and the right ureteral stone was "not definitively seen on the current exam."   At last visit on 06/27/2023: - "She reports that this Monday 06/24/2023 something else was noted to pass in her urine. She is having some mild dull ache ("not pain") in the right side / flank area today." She denied any acute urinary symptoms. - The plan was:  CT stone within 1 week.  Flomax PRN for stone symptoms.  Maintain adequate fluid intake. Return in about 6 months (around 12/28/2023) for KUB, UA, & f/u with Mikayla Jordan.  Since last visit: - CT on 07/01/2023 showed: 1. Right ureteral stone and obstruction observed previously have resolved. 2. Abdominal wall periumbilical defect containing omental fat.  ***refer to General Surgery for umbilical hernia  Today: She {Actions; denies-reports:120008} flank pain or abdominal pain. She {Actions; denies-reports:120008} fevers, nausea, or vomiting.  She {Actions; denies-reports:120008} increased urinary urgency, frequency, nocturia, dysuria, gross hematuria, hesitancy, straining to void, or sensations of incomplete emptying.   Fall Screening: Do you usually have a device to assist in your mobility? {yes/no:20286}  ***cane / ***walker / ***wheelchair  Medications: Current Outpatient Medications  Medication Sig Dispense Refill   amoxicillin-clavulanate (AUGMENTIN) 875-125 MG tablet Take 1 tablet by mouth every 12 (twelve) hours. (Patient  not taking: Reported on 06/27/2023) 14 tablet 0   chlorhexidine (PERIDEX) 0.12 % solution Use as directed 15 mLs in the mouth or throat 2 (two) times daily. (Patient not taking: Reported on 06/27/2023) 120 mL 0   fluticasone (FLONASE) 50 MCG/ACT nasal spray Place 2 sprays into both nostrils daily. 16 g 0   HYDROcodone-acetaminophen (NORCO/VICODIN) 5-325 MG tablet Take 1-2 tablets by mouth every 6 (six) hours as needed. (Patient not taking: Reported on 05/02/2023) 30 tablet 0   ibuprofen (ADVIL) 100 MG tablet Take 100 mg by mouth every 6 (six) hours as needed for fever.     tamsulosin (FLOMAX) 0.4 MG CAPS capsule Take 1 capsule by mouth daily as needed for stone symptoms. Advised to contact urology provider / request office visit if stone symptoms fail to resolve. Go to ER if symptoms become severe. 30 capsule 1   No current facility-administered medications for this visit.    Allergies: No Known Allergies  No past medical history on file. Past Surgical History:  Procedure Laterality Date   BREAST SURGERY Left    removal of benign breast   TONSILLECTOMY     No family history on file. Social History   Socioeconomic History   Marital status: Single    Spouse name: Not on file   Number of children: Not on file   Years of education: Not on file   Highest education level: Not on file  Occupational History   Not on file  Tobacco Use   Smoking status: Never   Smokeless tobacco: Never  Vaping Use   Vaping status: Never Used  Substance and Sexual Activity   Alcohol use: Never  Drug use: Never   Sexual activity: Yes  Other Topics Concern   Not on file  Social History Narrative   Not on file   Social Determinants of Health   Financial Resource Strain: Not on file  Food Insecurity: Not on file  Transportation Needs: Not on file  Physical Activity: Not on file  Stress: Not on file  Social Connections: Not on file  Intimate Partner Violence: Not on file    SUBJECTIVE  Review  of Systems*** Constitutional: Patient denies any unintentional weight loss or change in strength lntegumentary: Patient denies any rashes or pruritus Eyes: Patient denies ***dry eyes ENT: Patient ***denies dry mouth Cardiovascular: Patient denies chest pain or syncope Respiratory: Patient denies shortness of breath Gastrointestinal: Patient ***denies nausea, vomiting, constipation, or diarrhea Musculoskeletal: Patient denies muscle cramps or weakness Neurologic: Patient denies convulsions or seizures Allergic/Immunologic: Patient denies recent allergic reaction(s) Hematologic/Lymphatic: Patient denies bleeding tendencies Endocrine: Patient denies heat/cold intolerance  GU: As per HPI.  OBJECTIVE There were no vitals filed for this visit. There is no height or weight on file to calculate BMI.  Physical Examination*** Constitutional: No obvious distress; patient is non-toxic appearing  Cardiovascular: No visible lower extremity edema.  Respiratory: The patient does not have audible wheezing/stridor; respirations do not appear labored  Gastrointestinal: Abdomen non-distended Musculoskeletal: Normal ROM of UEs  Skin: No obvious rashes/open sores  Neurologic: CN 2-12 grossly intact Psychiatric: Answered questions appropriately with normal affect  Hematologic/Lymphatic/Immunologic: No obvious bruises or sites of spontaneous bleeding  UA: ***negative *** WBC/hpf, *** RBC/hpf, bacteria (***) PVR: *** ml  ASSESSMENT No diagnosis found.  ***We reviewed recent imaging results; ***awaiting radiology results, appears to have ***no acute findings.  ***For stone prevention: Advised adequate hydration and we discussed option to consider low oxalate diet given that calcium oxalate is the most common type of stone. Handout provided about stone prevention diet.  ***For recurrent stone formers: We discussed option to proceed with 24 hour urinalysis ([facility]) for metabolic evaluation, which may  help with targeted recommendations for dietary I medication therapies for stone prevention. Patient elected to ***proceed/ ***hold off.  Will plan to follow up in ***6 months / ***1 year with ***KUB ***RUS for stone surveillance or sooner if needed.  Pt verbalized understanding and agreement. All questions were answered.  PLAN Advised the following: Maintain adequate fluid intake daily. Drink citrus juice (lemon, lime or orange juice) routinely. Low oxalate diet. No follow-ups on file.  No orders of the defined types were placed in this encounter.   It has been explained that the patient is to follow regularly with their PCP in addition to all other providers involved in their care and to follow instructions provided by these respective offices. Patient advised to contact urology clinic if any urologic-pertaining questions, concerns, new symptoms or problems arise in the interim period.  There are no Patient Instructions on file for this visit.  Electronically signed by:  Donnita Falls, MSN, FNP-C, CUNP 08/08/2023 12:50 PM

## 2023-08-08 NOTE — Telephone Encounter (Signed)
-----   Message from Donnita Falls sent at 08/08/2023 12:55 PM EDT ----- I see that this patient is returning on Tuesday 10/8 for pain after passing her stones. Upon review today I see that on her recent CT there was an incidental finding of an umbilical hernia. Please ask patient if her pain is at the umbilicus? If so then I will just place a referral for her to see General Surgery for hernia management. Doesn't need to return to urology for that but happy to see her for any other concerns of course. Please let me know. Thanks!

## 2023-08-13 ENCOUNTER — Ambulatory Visit: Payer: Medicaid Other | Admitting: Urology

## 2023-08-13 DIAGNOSIS — K429 Umbilical hernia without obstruction or gangrene: Secondary | ICD-10-CM

## 2023-08-13 DIAGNOSIS — N200 Calculus of kidney: Secondary | ICD-10-CM

## 2023-12-31 ENCOUNTER — Telehealth: Payer: Self-pay

## 2023-12-31 NOTE — Telephone Encounter (Signed)
-----   Message from Mikayla Jordan sent at 12/31/2023  3:16 PM EST ----- Please remind patient to get KUB prior to visit on Thursday 2/27. Thanks.

## 2023-12-31 NOTE — Progress Notes (Unsigned)
 Name: Mikayla Jordan DOB: [birth year] MRN: [id number]  History of Present Illness: Ms. Parisi is a 23 y.o. female who presents today for follow up visit at [facility] [place].  - GU history: 1. Kidney stones.   At last visit on 06/27/2023: - "She reports that this Monday 06/24/2023 something else was noted to pass in her urine. She is having some mild dull ache ("not pain") in the right side / flank area today." She denied any acute urinary symptoms. - The plan was:  CT stone within 1 week.  Flomax PRN for stone symptoms.  Maintain adequate fluid intake. Return in about 6 months (around 12/28/2023) for KUB, UA, & f/u with Evette Georges NP.  Since last visit: Per CT on 07/01/2023: Right ureteral stone and obstruction observed previously have resolved. No GU stones, masses, or hydronephrosis; bladder unremarkable.  Today: KUB today: Awaiting radiology read; no appreciated per provider interpretation.  She denies recent episode of stone pain / passage. She denies acute flank pain / abdominal pain.  She denies increased urinary urgency, frequency, nocturia, dysuria, gross hematuria, hesitancy, straining to void, or sensations of incomplete emptying.   Fall Screening: Do you usually have a device to assist in your mobility? No   Medications: Current Outpatient Medications  Medication Sig Dispense Refill   ibuprofen (ADVIL) 100 MG tablet Take 100 mg by mouth every 6 (six) hours as needed for fever.     amoxicillin-clavulanate (AUGMENTIN) 875-125 MG tablet Take 1 tablet by mouth every 12 (twelve) hours. (Patient not taking: Reported on 01/02/2024) 14 tablet 0   chlorhexidine (PERIDEX) 0.12 % solution Use as directed 15 mLs in the mouth or throat 2 (two) times daily. (Patient not taking: Reported on 01/02/2024) 120 mL 0   fluticasone (FLONASE) 50 MCG/ACT nasal spray Place 2 sprays into both nostrils daily. (Patient not taking: Reported on 01/02/2024) 16 g 0    HYDROcodone-acetaminophen (NORCO/VICODIN) 5-325 MG tablet Take 1-2 tablets by mouth every 6 (six) hours as needed. (Patient not taking: Reported on 01/02/2024) 30 tablet 0   tamsulosin (FLOMAX) 0.4 MG CAPS capsule Take 1 capsule by mouth daily as needed for stone symptoms. Advised to contact urology provider / request office visit if stone symptoms fail to resolve. Go to ER if symptoms become severe. (Patient not taking: Reported on 01/02/2024) 30 capsule 1   No current facility-administered medications for this visit.    Allergies: No Known Allergies  No past medical history on file. Past Surgical History:  Procedure Laterality Date   BREAST SURGERY Left    removal of benign breast   TONSILLECTOMY     No family history on file. Social History   Socioeconomic History   Marital status: Single    Spouse name: Not on file   Number of children: Not on file   Years of education: Not on file   Highest education level: Not on file  Occupational History   Not on file  Tobacco Use   Smoking status: Never   Smokeless tobacco: Never  Vaping Use   Vaping status: Never Used  Substance and Sexual Activity   Alcohol use: Never   Drug use: Never   Sexual activity: Yes  Other Topics Concern   Not on file  Social History Narrative   Not on file   Social Drivers of Health   Financial Resource Strain: Not on file  Food Insecurity: Not on file  Transportation Needs: Not on file  Physical Activity: Not on file  Stress: Not on file  Social Connections: Not on file  Intimate Partner Violence: Not on file    Review of Systems Constitutional: Patient denies any unintentional weight loss or change in strength lntegumentary: Patient denies any rashes or pruritus Cardiovascular: Patient denies chest pain or syncope Respiratory: Patient denies shortness of breath Gastrointestinal: Patient denies nausea, vomiting, constipation, or diarrhea Musculoskeletal: Patient denies muscle cramps or  weakness Neurologic: Patient denies convulsions or seizures Allergic/Immunologic: Patient denies recent allergic reaction(s) Hematologic/Lymphatic: Patient denies bleeding tendencies Endocrine: Patient denies heat/cold intolerance  GU: As per HPI.  OBJECTIVE Vitals:   01/02/24 1451  BP: 131/85  Pulse: (!) 101   There is no height or weight on file to calculate BMI.  Physical Examination Constitutional: No obvious distress; patient is non-toxic appearing  Cardiovascular: No visible lower extremity edema.  Respiratory: The patient does not have audible wheezing/stridor; respirations do not appear labored  Gastrointestinal: Abdomen non-distended Musculoskeletal: Normal ROM of UEs  Skin: No obvious rashes/open sores  Neurologic: CN 2-12 grossly intact Psychiatric: Answered questions appropriately with normal affect  Hematologic/Lymphatic/Immunologic: No obvious bruises or sites of spontaneous bleeding  Urine microscopy: 6-10 WBC/hpf, 11-30 RBC/hpf, moderate bacteria (patient reports being on her menses currently)  ASSESSMENT Kidney stones - Plan: Urinalysis, Routine w reflex microscopic, DG Abd 1 View  Doing well today; no acute findings. Will plan for follow up in 6 months with KUB for stone surveillance or sooner if needed. Pt verbalized understanding and agreement. All questions were answered.  PLAN Advised the following: 1. Return in about 6 months (around 07/01/2024) for KUB, UA, & f/u with Evette Georges NP.  Orders Placed This Encounter  Procedures   Microscopic Examination   DG Abd 1 View    Standing Status:   Future    Expected Date:   07/01/2024    Expiration Date:   01/01/2025    Reason for Exam (SYMPTOM  OR DIAGNOSIS REQUIRED):   kidney stone    Is patient pregnant?:   Unknown (Please Explain)    Preferred imaging location?:   [facility]   Urinalysis, Routine w reflex microscopic    It has been explained that the patient is to follow regularly with  their PCP in addition to all other providers involved in their care and to follow instructions provided by these respective offices. Patient advised to contact urology clinic if any urologic-pertaining questions, concerns, new symptoms or problems arise in the interim period.  There are no Patient Instructions on file for this visit.  Electronically signed by:  Donnita Falls, FNP   01/02/24    3:52 PM

## 2023-12-31 NOTE — Telephone Encounter (Signed)
 Tried calling with no answer. Reminder sent via mychart.

## 2024-01-01 ENCOUNTER — Ambulatory Visit (HOSPITAL_COMMUNITY)
Admission: RE | Admit: 2024-01-01 | Discharge: 2024-01-01 | Disposition: A | Discharge status: Home / Self Care | Payer: Medicaid Other | Source: Ambulatory Visit | Attending: Urology | Admitting: Urology

## 2024-01-01 DIAGNOSIS — N200 Calculus of kidney: Secondary | ICD-10-CM | POA: Insufficient documentation

## 2024-01-02 ENCOUNTER — Ambulatory Visit: Payer: Medicaid Other | Admitting: Urology

## 2024-01-02 ENCOUNTER — Encounter: Payer: Self-pay | Admitting: Urology

## 2024-01-02 VITALS — BP 131/85 | HR 101

## 2024-01-02 DIAGNOSIS — N200 Calculus of kidney: Secondary | ICD-10-CM

## 2024-01-02 DIAGNOSIS — Z09 Encounter for follow-up examination after completed treatment for conditions other than malignant neoplasm: Secondary | ICD-10-CM

## 2024-01-02 DIAGNOSIS — Z87442 Personal history of urinary calculi: Secondary | ICD-10-CM

## 2024-01-02 LAB — URINALYSIS, ROUTINE W REFLEX MICROSCOPIC
Bilirubin, UA: NEGATIVE
Glucose, UA: NEGATIVE
Ketones, UA: NEGATIVE
Nitrite, UA: NEGATIVE
Specific Gravity, UA: 1.02 (ref 1.005–1.030)
Urobilinogen, Ur: 0.2 mg/dL (ref 0.2–1.0)
pH, UA: 7.5 (ref 5.0–7.5)

## 2024-01-02 LAB — MICROSCOPIC EXAMINATION

## 2024-02-06 ENCOUNTER — Encounter (INDEPENDENT_AMBULATORY_CARE_PROVIDER_SITE_OTHER): Payer: Self-pay

## 2024-02-06 ENCOUNTER — Encounter (INDEPENDENT_AMBULATORY_CARE_PROVIDER_SITE_OTHER): Payer: Medicaid Other | Admitting: Physician Assistant

## 2024-02-10 ENCOUNTER — Encounter (INDEPENDENT_AMBULATORY_CARE_PROVIDER_SITE_OTHER): Payer: Self-pay

## 2024-05-19 ENCOUNTER — Telehealth: Payer: Self-pay

## 2024-05-19 NOTE — Telephone Encounter (Signed)
 Tried to contact patient regarding her symptoms.  Unable to reach by phone or leave vm at this time.  Patient scheduled for a follow up on 07/02/2024

## 2024-05-19 NOTE — Telephone Encounter (Signed)
 Patient requested sooner appt due to increased pain.  Next available scheduled for 05/25/2024 with Lauraine Oz, FNP.  Patient will have KUB prior to visit.

## 2024-05-22 ENCOUNTER — Ambulatory Visit (HOSPITAL_COMMUNITY)
Admission: RE | Admit: 2024-05-22 | Discharge: 2024-05-22 | Disposition: A | Discharge status: Home / Self Care | Source: Ambulatory Visit | Attending: Urology | Admitting: Urology

## 2024-05-22 DIAGNOSIS — N200 Calculus of kidney: Secondary | ICD-10-CM | POA: Diagnosis present

## 2024-05-25 ENCOUNTER — Ambulatory Visit: Payer: Self-pay | Admitting: Urology

## 2024-05-25 ENCOUNTER — Ambulatory Visit: Admitting: Urology

## 2024-05-25 NOTE — Progress Notes (Deleted)
 Name: Mikayla Jordan DOB: 03/06/2001 MRN: [id number]  History of Present Illness: Mikayla Jordan is a 23 y.o. female who presents today for follow up visit at [facility] [place].  Relevant History includes: 1. Kidney stones.  At last visit on 01/02/2024: Doing well.   Since last visit: > 05/22/2024: KUB normal - no stones visualized. Multiple pelvic phleboliths.   Today: She {Actions; denies-reports:120008} flank pain or abdominal pain. She {Actions; denies-reports:120008} fevers, nausea, or vomiting.  She {Actions; denies-reports:120008} increased urinary urgency, frequency, nocturia, dysuria, gross hematuria, hesitancy, straining to void, or sensations of incomplete emptying.   Medications: Current Outpatient Medications  Medication Sig Dispense Refill   amoxicillin -clavulanate (AUGMENTIN ) 875-125 MG tablet Take 1 tablet by mouth every 12 (twelve) hours. (Patient not taking: Reported on 01/02/2024) 14 tablet 0   chlorhexidine  (PERIDEX ) 0.12 % solution Use as directed 15 mLs in the mouth or throat 2 (two) times daily. (Patient not taking: Reported on 01/02/2024) 120 mL 0   fluticasone  (FLONASE ) 50 MCG/ACT nasal spray Place 2 sprays into both nostrils daily. (Patient not taking: Reported on 01/02/2024) 16 g 0   HYDROcodone -acetaminophen  (NORCO/VICODIN) 5-325 MG tablet Take 1-2 tablets by mouth every 6 (six) hours as needed. (Patient not taking: Reported on 01/02/2024) 30 tablet 0   ibuprofen (ADVIL) 100 MG tablet Take 100 mg by mouth every 6 (six) hours as needed for fever.     tamsulosin  (FLOMAX ) 0.4 MG CAPS capsule Take 1 capsule by mouth daily as needed for stone symptoms. Advised to contact urology provider / request office visit if stone symptoms fail to resolve. Go to ER if symptoms become severe. (Patient not taking: Reported on 01/02/2024) 30 capsule 1   No current facility-administered medications for this visit.    Allergies: No Known Allergies  No past medical  history on file. Past Surgical History:  Procedure Laterality Date   BREAST SURGERY Left    removal of benign breast   TONSILLECTOMY     No family history on file. Social History   Socioeconomic History   Marital status: Single    Spouse name: Not on file   Number of children: Not on file   Years of education: Not on file   Highest education level: Not on file  Occupational History   Not on file  Tobacco Use   Smoking status: Never   Smokeless tobacco: Never  Vaping Use   Vaping status: Never Used  Substance and Sexual Activity   Alcohol use: Never   Drug use: Never   Sexual activity: Yes  Other Topics Concern   Not on file  Social History Narrative   Not on file   Social Drivers of Health   Financial Resource Strain: Not on file  Food Insecurity: Not on file  Transportation Needs: Not on file  Physical Activity: Not on file  Stress: Not on file  Social Connections: Not on file  Intimate Partner Violence: Not on file    Review of Systems Constitutional: Patient denies any unintentional weight loss or change in strength lntegumentary: Patient denies any rashes or pruritus Cardiovascular: Patient denies chest pain or syncope Respiratory: Patient denies shortness of breath Gastrointestinal: ***Patient denies nausea, vomiting, constipation, or diarrhea ***As per HPI Musculoskeletal: Patient denies muscle cramps or weakness Neurologic: Patient denies convulsions or seizures Allergic/Immunologic: Patient denies recent allergic reaction(s) Hematologic/Lymphatic: Patient denies bleeding tendencies Endocrine: Patient denies heat/cold intolerance  GU: As per HPI.  OBJECTIVE There were no vitals filed for this visit. There  is no height or weight on file to calculate BMI.  Physical Examination Constitutional: No obvious distress; patient is non-toxic appearing  Cardiovascular: No visible lower extremity edema.  Respiratory: The patient does not have audible  wheezing/stridor; respirations do not appear labored  Gastrointestinal: Abdomen non-distended Musculoskeletal: Normal ROM of UEs  Skin: No obvious rashes/open sores  Neurologic: CN 2-12 grossly intact Psychiatric: Answered questions appropriately with normal affect  Hematologic/Lymphatic/Immunologic: No obvious bruises or sites of spontaneous bleeding  UA: ***negative ***positive for *** leukocytes, *** blood, ***nitrites Urine microscopy: *** WBC/hpf, *** RBC/hpf, *** bacteria ***glucosuria (secondary to ***Jardiance ***Farxiga use) ***otherwise unremarkable  PVR: *** ml  ASSESSMENT No diagnosis found. ***  We agreed to plan for follow up in *** months / ***1 year or sooner if needed. Patient verbalized understanding of and agreement with current plan. All questions were answered.  PLAN Advised the following: 1. *** 2. ***No follow-ups on file.  No orders of the defined types were placed in this encounter.   It has been explained that the patient is to follow regularly with their PCP in addition to all other providers involved in their care and to follow instructions provided by these respective offices. Patient advised to contact urology clinic if any urologic-pertaining questions, concerns, new symptoms or problems arise in the interim period.  There are no Patient Instructions on file for this visit.  Electronically signed by:  Lauraine JAYSON Oz, FNP   05/25/24    1:13 PM

## 2024-05-26 NOTE — Progress Notes (Deleted)
 Name: Mikayla Jordan DOB: 2001/07/14 MRN: [id number]  History of Present Illness: Ms. Mikayla Jordan is a 23 y.o. female who presents today for follow up visit at [facility] [place].  Relevant History includes: 1. Kidney stones.  At last visit on 01/02/2024: Doing well.   Since last visit: > 05/22/2024: KUB normal - no stones visualized. Multiple pelvic phleboliths.   Today: She {Actions; denies-reports:120008} flank pain or abdominal pain. She {Actions; denies-reports:120008} fevers, nausea, or vomiting.  She {Actions; denies-reports:120008} increased urinary urgency, frequency, nocturia, dysuria, gross hematuria, hesitancy, straining to void, or sensations of incomplete emptying.   Medications: Current Outpatient Medications  Medication Sig Dispense Refill   amoxicillin -clavulanate (AUGMENTIN ) 875-125 MG tablet Take 1 tablet by mouth every 12 (twelve) hours. (Patient not taking: Reported on 01/02/2024) 14 tablet 0   chlorhexidine  (PERIDEX ) 0.12 % solution Use as directed 15 mLs in the mouth or throat 2 (two) times daily. (Patient not taking: Reported on 01/02/2024) 120 mL 0   fluticasone  (FLONASE ) 50 MCG/ACT nasal spray Place 2 sprays into both nostrils daily. (Patient not taking: Reported on 01/02/2024) 16 g 0   HYDROcodone -acetaminophen  (NORCO/VICODIN) 5-325 MG tablet Take 1-2 tablets by mouth every 6 (six) hours as needed. (Patient not taking: Reported on 01/02/2024) 30 tablet 0   ibuprofen (ADVIL) 100 MG tablet Take 100 mg by mouth every 6 (six) hours as needed for fever.     tamsulosin  (FLOMAX ) 0.4 MG CAPS capsule Take 1 capsule by mouth daily as needed for stone symptoms. Advised to contact urology provider / request office visit if stone symptoms fail to resolve. Go to ER if symptoms become severe. (Patient not taking: Reported on 01/02/2024) 30 capsule 1   No current facility-administered medications for this visit.    Allergies: No Known Allergies  No past medical  history on file. Past Surgical History:  Procedure Laterality Date   BREAST SURGERY Left    removal of benign breast   TONSILLECTOMY     No family history on file. Social History   Socioeconomic History   Marital status: Single    Spouse name: Not on file   Number of children: Not on file   Years of education: Not on file   Highest education level: Not on file  Occupational History   Not on file  Tobacco Use   Smoking status: Never   Smokeless tobacco: Never  Vaping Use   Vaping status: Never Used  Substance and Sexual Activity   Alcohol use: Never   Drug use: Never   Sexual activity: Yes  Other Topics Concern   Not on file  Social History Narrative   Not on file   Social Drivers of Health   Financial Resource Strain: Not on file  Food Insecurity: Not on file  Transportation Needs: Not on file  Physical Activity: Not on file  Stress: Not on file  Social Connections: Not on file  Intimate Partner Violence: Not on file    Review of Systems Constitutional: Patient denies any unintentional weight loss or change in strength lntegumentary: Patient denies any rashes or pruritus Cardiovascular: Patient denies chest pain or syncope Respiratory: Patient denies shortness of breath Gastrointestinal: ***Patient denies nausea, vomiting, constipation, or diarrhea ***As per HPI Musculoskeletal: Patient denies muscle cramps or weakness Neurologic: Patient denies convulsions or seizures Allergic/Immunologic: Patient denies recent allergic reaction(s) Hematologic/Lymphatic: Patient denies bleeding tendencies Endocrine: Patient denies heat/cold intolerance  GU: As per HPI.  OBJECTIVE There were no vitals filed for this visit. There  is no height or weight on file to calculate BMI.  Physical Examination Constitutional: No obvious distress; patient is non-toxic appearing  Cardiovascular: No visible lower extremity edema.  Respiratory: The patient does not have audible  wheezing/stridor; respirations do not appear labored  Gastrointestinal: Abdomen non-distended Musculoskeletal: Normal ROM of UEs  Skin: No obvious rashes/open sores  Neurologic: CN 2-12 grossly intact Psychiatric: Answered questions appropriately with normal affect  Hematologic/Lymphatic/Immunologic: No obvious bruises or sites of spontaneous bleeding  UA: ***negative ***positive for *** leukocytes, *** blood, ***nitrites Urine microscopy: *** WBC/hpf, *** RBC/hpf, *** bacteria ***glucosuria (secondary to ***Jardiance ***Farxiga use) ***otherwise unremarkable  PVR: *** ml  ASSESSMENT No diagnosis found. ***  We agreed to plan for follow up in *** months / ***1 year or sooner if needed. Patient verbalized understanding of and agreement with current plan. All questions were answered.  PLAN Advised the following: 1. *** 2. ***No follow-ups on file.  No orders of the defined types were placed in this encounter.   It has been explained that the patient is to follow regularly with their PCP in addition to all other providers involved in their care and to follow instructions provided by these respective offices. Patient advised to contact urology clinic if any urologic-pertaining questions, concerns, new symptoms or problems arise in the interim period.  There are no Patient Instructions on file for this visit.  Electronically signed by:  Lauraine JAYSON Oz, FNP   05/26/24    12:23 PM

## 2024-05-27 ENCOUNTER — Ambulatory Visit: Admitting: Urology

## 2024-05-27 DIAGNOSIS — N200 Calculus of kidney: Secondary | ICD-10-CM

## 2024-06-18 ENCOUNTER — Telehealth: Payer: Self-pay

## 2024-06-18 NOTE — Telephone Encounter (Signed)
 Patient called to inform of appointment change with no answer. Voicemail not set up.

## 2024-06-25 ENCOUNTER — Ambulatory Visit: Admitting: Urology

## 2024-07-02 ENCOUNTER — Ambulatory Visit: Payer: Medicaid Other | Admitting: Urology

## 2024-07-13 ENCOUNTER — Ambulatory Visit: Admitting: Urology

## 2024-07-13 DIAGNOSIS — N200 Calculus of kidney: Secondary | ICD-10-CM
